# Patient Record
Sex: Female | Born: 1970 | Race: White | Hispanic: No | Marital: Married | Smoking: Never smoker
Health system: Southern US, Community
[De-identification: ages and names within clinical notes are randomized; demographics above are authoritative.]

## PROBLEM LIST (undated history)

## (undated) DIAGNOSIS — Z973 Presence of spectacles and contact lenses: Secondary | ICD-10-CM

## (undated) DIAGNOSIS — Z87820 Personal history of traumatic brain injury: Secondary | ICD-10-CM

## (undated) DIAGNOSIS — R002 Palpitations: Secondary | ICD-10-CM

## (undated) DIAGNOSIS — Z8249 Family history of ischemic heart disease and other diseases of the circulatory system: Secondary | ICD-10-CM

## (undated) DIAGNOSIS — Z9289 Personal history of other medical treatment: Secondary | ICD-10-CM

## (undated) DIAGNOSIS — N938 Other specified abnormal uterine and vaginal bleeding: Secondary | ICD-10-CM

## (undated) DIAGNOSIS — I10 Essential (primary) hypertension: Secondary | ICD-10-CM

## (undated) DIAGNOSIS — Z8639 Personal history of other endocrine, nutritional and metabolic disease: Secondary | ICD-10-CM

## (undated) HISTORY — DX: Personal history of other medical treatment: Z92.89

## (undated) HISTORY — DX: Palpitations: R00.2

## (undated) HISTORY — DX: Family history of ischemic heart disease and other diseases of the circulatory system: Z82.49

## (undated) HISTORY — PX: UMBILICAL HERNIA REPAIR: SHX196

## (undated) HISTORY — PX: HERNIA REPAIR: SHX51

---

## 1998-04-11 ENCOUNTER — Other Ambulatory Visit: Admission: RE | Admit: 1998-04-11 | Discharge: 1998-04-11 | Payer: Self-pay | Admitting: Obstetrics and Gynecology

## 1999-04-28 ENCOUNTER — Other Ambulatory Visit: Admission: RE | Admit: 1999-04-28 | Discharge: 1999-04-28 | Payer: Self-pay | Admitting: Obstetrics and Gynecology

## 2000-05-07 ENCOUNTER — Other Ambulatory Visit: Admission: RE | Admit: 2000-05-07 | Discharge: 2000-05-07 | Payer: Self-pay | Admitting: Obstetrics and Gynecology

## 2000-11-04 ENCOUNTER — Other Ambulatory Visit: Admission: RE | Admit: 2000-11-04 | Discharge: 2000-11-04 | Payer: Self-pay | Admitting: Obstetrics and Gynecology

## 2001-05-23 ENCOUNTER — Other Ambulatory Visit: Admission: RE | Admit: 2001-05-23 | Discharge: 2001-05-23 | Payer: Self-pay | Admitting: Obstetrics and Gynecology

## 2002-09-11 ENCOUNTER — Other Ambulatory Visit: Admission: RE | Admit: 2002-09-11 | Discharge: 2002-09-11 | Payer: Self-pay | Admitting: Obstetrics and Gynecology

## 2003-04-15 ENCOUNTER — Inpatient Hospital Stay (HOSPITAL_COMMUNITY): Admission: AD | Admit: 2003-04-15 | Discharge: 2003-04-17 | Payer: Self-pay | Admitting: Obstetrics and Gynecology

## 2003-04-18 ENCOUNTER — Encounter: Admission: RE | Admit: 2003-04-18 | Discharge: 2003-05-18 | Payer: Self-pay | Admitting: Obstetrics and Gynecology

## 2003-05-26 ENCOUNTER — Other Ambulatory Visit: Admission: RE | Admit: 2003-05-26 | Discharge: 2003-05-26 | Payer: Self-pay | Admitting: Obstetrics and Gynecology

## 2003-05-31 ENCOUNTER — Encounter: Payer: Self-pay | Admitting: Internal Medicine

## 2003-05-31 LAB — CONVERTED CEMR LAB

## 2003-06-18 ENCOUNTER — Encounter: Admission: RE | Admit: 2003-06-18 | Discharge: 2003-07-18 | Payer: Self-pay | Admitting: Obstetrics and Gynecology

## 2004-06-15 ENCOUNTER — Ambulatory Visit: Payer: Self-pay | Admitting: Internal Medicine

## 2004-09-29 ENCOUNTER — Ambulatory Visit: Payer: Self-pay | Admitting: Internal Medicine

## 2005-06-18 ENCOUNTER — Ambulatory Visit: Payer: Self-pay | Admitting: Internal Medicine

## 2005-06-27 ENCOUNTER — Ambulatory Visit: Payer: Self-pay | Admitting: Internal Medicine

## 2006-06-11 ENCOUNTER — Ambulatory Visit: Payer: Self-pay | Admitting: Family Medicine

## 2006-07-03 ENCOUNTER — Ambulatory Visit: Payer: Self-pay | Admitting: Internal Medicine

## 2007-02-18 ENCOUNTER — Encounter: Payer: Self-pay | Admitting: Internal Medicine

## 2007-03-12 ENCOUNTER — Inpatient Hospital Stay (HOSPITAL_COMMUNITY): Admission: AD | Admit: 2007-03-12 | Discharge: 2007-03-12 | Payer: Self-pay | Admitting: Obstetrics & Gynecology

## 2007-03-13 ENCOUNTER — Inpatient Hospital Stay (HOSPITAL_COMMUNITY): Admission: AD | Admit: 2007-03-13 | Discharge: 2007-03-13 | Payer: Self-pay | Admitting: Obstetrics and Gynecology

## 2007-03-18 ENCOUNTER — Ambulatory Visit: Payer: Self-pay | Admitting: Internal Medicine

## 2007-05-19 ENCOUNTER — Encounter: Payer: Self-pay | Admitting: Internal Medicine

## 2007-06-06 ENCOUNTER — Inpatient Hospital Stay (HOSPITAL_COMMUNITY): Admission: RE | Admit: 2007-06-06 | Discharge: 2007-06-08 | Payer: Self-pay | Admitting: Obstetrics and Gynecology

## 2007-06-09 ENCOUNTER — Encounter: Admission: RE | Admit: 2007-06-09 | Discharge: 2007-07-08 | Payer: Self-pay | Admitting: Obstetrics and Gynecology

## 2007-07-01 ENCOUNTER — Encounter: Payer: Self-pay | Admitting: Internal Medicine

## 2007-07-09 ENCOUNTER — Encounter: Admission: RE | Admit: 2007-07-09 | Discharge: 2007-08-08 | Payer: Self-pay | Admitting: Obstetrics and Gynecology

## 2007-08-09 ENCOUNTER — Encounter: Admission: RE | Admit: 2007-08-09 | Discharge: 2007-09-08 | Payer: Self-pay | Admitting: Obstetrics and Gynecology

## 2007-09-09 ENCOUNTER — Encounter: Admission: RE | Admit: 2007-09-09 | Discharge: 2007-09-27 | Payer: Self-pay | Admitting: Obstetrics and Gynecology

## 2007-10-07 ENCOUNTER — Encounter: Admission: RE | Admit: 2007-10-07 | Discharge: 2007-11-06 | Payer: Self-pay | Admitting: Obstetrics and Gynecology

## 2007-10-31 ENCOUNTER — Ambulatory Visit: Payer: Self-pay | Admitting: Internal Medicine

## 2007-11-07 ENCOUNTER — Encounter: Admission: RE | Admit: 2007-11-07 | Discharge: 2007-12-01 | Payer: Self-pay | Admitting: Obstetrics and Gynecology

## 2007-11-19 ENCOUNTER — Encounter: Payer: Self-pay | Admitting: Internal Medicine

## 2007-12-30 ENCOUNTER — Ambulatory Visit: Payer: Self-pay | Admitting: Internal Medicine

## 2007-12-30 DIAGNOSIS — T6191XA Toxic effect of unspecified seafood, accidental (unintentional), initial encounter: Secondary | ICD-10-CM | POA: Insufficient documentation

## 2007-12-30 DIAGNOSIS — L503 Dermatographic urticaria: Secondary | ICD-10-CM

## 2007-12-30 DIAGNOSIS — T61781A Other shellfish poisoning, accidental (unintentional), initial encounter: Secondary | ICD-10-CM | POA: Insufficient documentation

## 2007-12-31 ENCOUNTER — Encounter (INDEPENDENT_AMBULATORY_CARE_PROVIDER_SITE_OTHER): Payer: Self-pay | Admitting: *Deleted

## 2008-01-26 ENCOUNTER — Ambulatory Visit: Payer: Self-pay | Admitting: Internal Medicine

## 2008-01-26 LAB — CONVERTED CEMR LAB
Basophils Absolute: 0 10*3/uL (ref 0.0–0.1)
Eosinophils Absolute: 0.1 10*3/uL (ref 0.0–0.7)
IgE (Immunoglobulin E), Serum: 83.9 intl units/mL (ref 0.0–180.0)
Lymphocytes Relative: 39 % (ref 12.0–46.0)
MCV: 89.5 fL (ref 78.0–100.0)
Monocytes Absolute: 0.4 10*3/uL (ref 0.1–1.0)
Neutro Abs: 2.7 10*3/uL (ref 1.4–7.7)
Platelets: 239 10*3/uL (ref 150–400)
RDW: 13.2 % (ref 11.5–14.6)
WBC: 5.2 10*3/uL (ref 4.5–10.5)

## 2008-02-23 ENCOUNTER — Ambulatory Visit: Payer: Self-pay | Admitting: Internal Medicine

## 2008-02-28 ENCOUNTER — Encounter: Payer: Self-pay | Admitting: Internal Medicine

## 2008-04-28 ENCOUNTER — Ambulatory Visit: Payer: Self-pay | Admitting: Internal Medicine

## 2008-07-29 ENCOUNTER — Ambulatory Visit: Payer: Self-pay | Admitting: Family Medicine

## 2009-05-24 ENCOUNTER — Ambulatory Visit: Payer: Self-pay | Admitting: Internal Medicine

## 2009-08-05 ENCOUNTER — Ambulatory Visit: Payer: Self-pay | Admitting: Family

## 2009-08-05 ENCOUNTER — Telehealth: Payer: Self-pay | Admitting: Family

## 2009-08-05 ENCOUNTER — Ambulatory Visit: Payer: Self-pay | Admitting: Internal Medicine

## 2009-08-05 LAB — CONVERTED CEMR LAB: Rapid Strep: NEGATIVE

## 2009-08-20 ENCOUNTER — Ambulatory Visit: Payer: Self-pay | Admitting: Family Medicine

## 2009-08-20 DIAGNOSIS — R197 Diarrhea, unspecified: Secondary | ICD-10-CM

## 2009-08-22 ENCOUNTER — Encounter (INDEPENDENT_AMBULATORY_CARE_PROVIDER_SITE_OTHER): Payer: Self-pay | Admitting: *Deleted

## 2009-08-22 ENCOUNTER — Ambulatory Visit: Payer: Self-pay | Admitting: Internal Medicine

## 2009-08-22 ENCOUNTER — Encounter: Payer: Self-pay | Admitting: Family Medicine

## 2009-08-22 LAB — CONVERTED CEMR LAB
OCCULT 1: NEGATIVE
OCCULT 3: NEGATIVE

## 2009-08-23 ENCOUNTER — Telehealth (INDEPENDENT_AMBULATORY_CARE_PROVIDER_SITE_OTHER): Payer: Self-pay | Admitting: *Deleted

## 2010-01-16 ENCOUNTER — Ambulatory Visit: Payer: Self-pay | Admitting: Internal Medicine

## 2010-01-16 DIAGNOSIS — M545 Low back pain: Secondary | ICD-10-CM

## 2010-01-16 DIAGNOSIS — R350 Frequency of micturition: Secondary | ICD-10-CM

## 2010-01-16 DIAGNOSIS — M549 Dorsalgia, unspecified: Secondary | ICD-10-CM | POA: Insufficient documentation

## 2010-01-16 LAB — CONVERTED CEMR LAB
Blood in Urine, dipstick: NEGATIVE
Glucose, Urine, Semiquant: NEGATIVE
Ketones, urine, test strip: NEGATIVE
Nitrite: NEGATIVE
WBC Urine, dipstick: NEGATIVE
pH: 7

## 2010-01-17 ENCOUNTER — Encounter: Payer: Self-pay | Admitting: Internal Medicine

## 2010-02-06 ENCOUNTER — Ambulatory Visit (HOSPITAL_BASED_OUTPATIENT_CLINIC_OR_DEPARTMENT_OTHER): Admission: RE | Admit: 2010-02-06 | Discharge: 2010-02-06 | Payer: Self-pay | Admitting: General Surgery

## 2010-05-15 ENCOUNTER — Encounter: Payer: Self-pay | Admitting: Internal Medicine

## 2010-08-28 ENCOUNTER — Ambulatory Visit
Admission: RE | Admit: 2010-08-28 | Discharge: 2010-08-28 | Payer: Self-pay | Source: Home / Self Care | Attending: Internal Medicine | Admitting: Internal Medicine

## 2010-08-28 DIAGNOSIS — J019 Acute sinusitis, unspecified: Secondary | ICD-10-CM | POA: Insufficient documentation

## 2010-08-29 NOTE — Assessment & Plan Note (Signed)
Summary: SORE THROAT,COUGH/RH......Marland Kitchen   Vital Signs:  Patient profile:   40 year old female Weight:      148.38 pounds Temp:     98.3 degrees F oral Pulse rate:   64 / minute BP sitting:   100 / 60  Vitals Entered By: Kandice Hams (August 05, 2009 11:49 AM) CC: c/o cough, sore throat,    Primary Care Provider:  Rockie Neighbours  CC:  c/o cough, sore throat, and .  History of Present Illness: Ms Bowens is a 40 year old female with cough cold symptoms started 16 days ago- then developed sinus congestion.  Sinus congestion has improved, but cough has persisted and patient has had sore throat x 4 days.  Notes that her son has strep throat.  Used mucinex with some relief.  Allergies: No Known Drug Allergies  Review of Systems       notes low grade temp last week, now resolved.  Denies, nausea/vomitting/diarrhea.  Bilateral ear tenderness L greater than R  Physical Exam  General:  Well-developed,well-nourished,in no acute distress; alert,appropriate and cooperative throughout examination Ears:  R TM, mild bulge, no erythema, L TM retracted Mouth:  Oral mucosa and oropharynx without lesions or exudates.  Teeth in good repair. Neck:  mild anterior cervical LAD Lungs:  Normal respiratory effort, chest expands symmetrically. Lungs are clear to auscultation, no crackles or wheezes. Heart:  Normal rate and regular rhythm. S1 and S2 normal without gallop, murmur, click, rub or other extra sounds.   Impression & Recommendations:  Problem # 1:  ACUTE BRONCHITIS (ICD-466.0) Assessment New Given 2 week hx of cough, will plan to treat with cefdinir and check a cxr to r/o pneumonia Orders: T-2 View CXR (71020TC)  Her updated medication list for this problem includes:    Cefdinir 300 Mg Caps (Cefdinir) ..... One tablet by mouth two times a day x 7 days    Tessalon Perles 100 Mg Caps (Benzonatate) ..... One cap by mouth three times a day as needed cough  Complete Medication List: 1)   Multivitamins Tabs (Multiple vitamin) .... Once daily 2)  Cefdinir 300 Mg Caps (Cefdinir) .... One tablet by mouth two times a day x 7 days 3)  Tessalon Perles 100 Mg Caps (Benzonatate) .... One cap by mouth three times a day as needed cough  Patient Instructions: 1)  Please call for follow up  if your symptoms worsen or do not improve. 2)  We will call you with your chest x-ray results Prescriptions: TESSALON PERLES 100 MG CAPS (BENZONATATE) one cap by mouth three times a day as needed cough  #30 x 0   Entered and Authorized by:   Lemont Fillers FNP   Signed by:   Lemont Fillers FNP on 08/05/2009   Method used:   Electronically to        [id number] (retail)       [address]       [place], [place]  [address]       Ph: [phone] or [phone]       Fax: [phone]   RxID:   [id number] CEFDINIR 300 MG CAPS (CEFDINIR) one tablet by mouth two times a day x 7 days  #14 x 0   Entered and Authorized by:   Lemont Fillers FNP   Signed by:   Lemont Fillers FNP on 08/05/2009   Method used:  Electronically to        [id number] (retail)       [address]       [place], [place]  [address]       Ph: [phone] or [phone]       Fax: [phone]   RxID:   [id number]   Laboratory Results    Other Tests  Rapid Strep: negative

## 2010-08-29 NOTE — Assessment & Plan Note (Signed)
Summary: possible kidney infection//kn   Vital Signs:  Patient profile:   40 year old female Weight:      153.8 pounds Temp:     98.8 degrees F oral Pulse rate:   72 / minute Resp:     15 per minute BP sitting:   124 / 70  (left arm) Cuff size:   large  Vitals Entered By: Shonna Chock (January 16, 2010 2:09 PM) CC: Left lower back pain and frequent urination off/on x 1 week, Back pain Comments REVIEWED MED LIST, PATIENT AGREED DOSE AND INSTRUCTION CORRECT    Primary Care Provider:  Rockie Neighbours  CC:  Left lower back pain and frequent urination off/on x 1 week and Back pain.  History of Present Illness:  Back Pain      This is a 40 year old woman who presents with  sharp back pain.  The patient reports low grade fever ( 99.0) and rest pain, but denies chills, weakness, loss of sensation, fecal incontinence, urinary incontinence, urinary retention, dysuria, and inability to care for self.  The pain is located in the left low back.  The pain began at home and gradually.  The pain  was  made worse by urination after drinking wine 01/14/2010.  The pain is made better by inactivity/ rest. She repeatedly lifts her 30 # two year old; this causes pain in area. Umbilical hernia planned for 02/06/10 by Dr Lindie Spruce.  Allergies: 1)  ! * Omnicef  Review of Systems General:  Denies chills and sweats. GU:  Denies discharge and hematuria. Derm:  Denies lesion(s) and rash. Neuro:  Denies brief paralysis, numbness, tingling, and weakness.  Physical Exam  General:  well-nourished,in no acute distress; alert,appropriate and cooperative throughout examination Lungs:  Normal respiratory effort, chest expands symmetrically. Lungs are clear to auscultation, no crackles or wheezes. Heart:  regular rhythm, no murmur, no gallop, no rub, no JVD, and bradycardia.   Abdomen:  Bowel sounds positive,abdomen soft and non-tender without masses, organomegaly . Umbilical  hernia  noted. Msk:  No deformity or scoliosis  noted of thoracic or lumbar spine.  Slight tenderness to percussion LLS area Pulses:  R and L dorsalis pedis and posterior tibial pulses are full and equal bilaterally Extremities:  No clubbing, cyanosis, edema, or deformity noted with normal full range of motion of all joints. Neg SLR  Neurologic:  alert & oriented X3, strength normal in all extremities, gait normal, and DTRs symmetrical and normal.   Skin:  Intact without suspicious lesions or rashes Cervical Nodes:  No lymphadenopathy noted Axillary Nodes:  No palpable lymphadenopathy Psych:  memory intact for recent and remote, normally interactive, and good eye contact.     Impression & Recommendations:  Problem # 1:  LOW BACK PAIN, ACUTE (ICD-724.2)  Her updated medication list for this problem includes:    Cyclobenzaprine Hcl 5 Mg Tabs (Cyclobenzaprine hcl) .Marland Kitchen... 1-2 at bedtime as needed for back pain    Tramadol Hcl 50 Mg Tabs (Tramadol hcl) .Marland Kitchen... 1 every 6 hrs as needed back pain  Problem # 2:  FREQUENCY, URINARY (ICD-788.41)  Orders: T-Culture, Urine ([id number])  Complete Medication List: 1)  Multivitamins Tabs (Multiple vitamin) .... Once daily 2)  Cyclobenzaprine Hcl 5 Mg Tabs (Cyclobenzaprine hcl) .Marland Kitchen.. 1-2 at bedtime as needed for back pain 3)  Tramadol Hcl 50 Mg Tabs (Tramadol hcl) .Marland Kitchen.. 1 every 6 hrs as needed back pain  Other Orders: UA Dipstick w/o Micro (manual) (09811)  Patient Instructions: 1)  Avoid lifting if possible.Proper technique if absolutely necessary Prescriptions: TRAMADOL HCL 50 MG TABS (TRAMADOL HCL) 1 every 6 hrs as needed back pain  #30 x 0   Entered and Authorized by:   Marga Melnick MD   Signed by:   Marga Melnick MD on 01/16/2010   Method used:   Faxed to ...       [facility]  [address] [id number] (retail)       [address]       [place], [place]  [address]       Ph: [phone] or [phone]       Fax: [phone]   RxID:   [id number] CYCLOBENZAPRINE HCL  5 MG TABS (CYCLOBENZAPRINE HCL) 1-2 at bedtime as needed for back pain  #20 x 0   Entered and Authorized by:   Marga Melnick MD   Signed by:   Marga Melnick MD on 01/16/2010   Method used:   Faxed to ...       [facility]  [address] [id number] (retail)       [address]       [place], [place]  [address]       Ph: [phone] or [phone]       Fax: [phone]   RxID:   [id number]   Laboratory Results   Urine Tests   Date/Time Reported: January 16, 2010 2:05 PM   Routine Urinalysis   Color: colorless Appearance: Clear Glucose: negative   (Normal Range: Negative) Bilirubin: negative   (Normal Range: Negative) Ketone: negative   (Normal Range: Negative) Spec. Gravity: <1.005   (Normal Range: 1.003-1.035) Blood: negative   (Normal Range: Negative) pH: 7.0   (Normal Range: 5.0-8.0) Protein: negative   (Normal Range: Negative) Urobilinogen: negative   (Normal Range: 0-1) Nitrite: negative   (Normal Range: Negative) Leukocyte Esterace: negative   (Normal Range: Negative)    Comments: Floydene Flock  January 16, 2010 2:05 PM

## 2010-08-29 NOTE — Miscellaneous (Signed)
Summary: Flu/[facility]  [facility]   Imported By: Lanelle Bal 06/07/2010 08:40:15  _____________________________________________________________________  External Attachment:    Type:   Image     Comment:   External Document

## 2010-08-29 NOTE — Assessment & Plan Note (Signed)
Summary: diahrea/fever/kdc   Vital Signs:  Patient profile:   40 year old female Weight:      145 pounds Temp:     98.9 degrees F oral Pulse rate:   104 / minute Pulse rhythm:   regular BP sitting:   100 / 70  (left arm) Cuff size:   regular  Vitals Entered By: Mervin Hack CMA Duncan Dull) (August 20, 2009 10:35 AM) CC: diarrheax10days?, Diarrhea   History of Present Illness:  Diarrhea      This is a 40 year old woman who presents with Diarrhea.  The symptoms began 1 week ago.  The patient complains of 4-6 stools per day and watery/unformed stools, but denies 3 stools or less per day, >6 stools per day, semiformed/loose stools, voluminous stools, blood in stool, mucus in stool, greasy stools, malodorous stools, fecal urgency, fecal soiling, alternating diarrhea/constipation, nocturnal diarrhea, fasting diarrhea, bloating, gassiness, abrupt onset of symptoms, and gradual onset of symptoms.  Associated symptoms include fever, abdominal cramps, nausea, and lightheadedness.  The patient denies abdominal pain, vomiting, increased thirst, weight loss, joint pains, mouth ulcers, and eye redness.  The symptoms are worse with any food.  Patient's risk factors for diarrhea include recent antibiotic use.    Current Medications (verified): 1)  Multivitamins   Tabs (Multiple Vitamin) .... Once Daily 2)  Metronidazole 500 Mg Tabs (Metronidazole) .Marland Kitchen.. 1 By Mouth Three Times A Day X 10 Days 3)  Levsin/sl 0.125 Mg Subl (Hyoscyamine Sulfate) .Marland Kitchen.. 1-2  Sl  Every 4 Hours As Needed  Allergies (verified): No Known Drug Allergies  Past History:  Past medical, surgical, family and social histories (including risk factors) reviewed for relevance to current acute and chronic problems.  Past Medical History: Reviewed history from 01/26/2008 and no changes required. syncope age [age] positive ppd 1999-increased lipids 09/2001-liver enzymes increased PAT during pregnancy  FH:  P-unlce  melanoma father-HTN,TIA's, hockey injury high cholesterol aunt-rectal cancer m-uncle-schizophrenia  Past Surgical History: Reviewed history from 12/30/2007 and no changes required. fractured skull at age 46,no seizure sequella  syncope age 10, negative EEG  Family History: Reviewed history from 01/26/2008 and no changes required. cancer-melanoma allergies-seasonal  Social History: Reviewed history from 01/26/2008 and no changes required. 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  Teeth in good repair. Abdomen:  Bowel sounds positive,abdomen soft and non-tender without masses, organomegaly or hernias noted. Psych:  Cognition and judgment appear intact. Alert and cooperative with normal attention span and concentration. No apparent delusions, illusions, hallucinations   Impression & Recommendations:  Problem # 1:  DIARRHEA (ICD-787.91) ? C diff rx flagyl and cipro to er over weekend if symptoms worsen---otherwise f/u Dr Alwyn Ren next week Orders: T-Culture, C-Diff Toxin A/B [id number] [phone])  Discussed symptom control and diet. Call if worsening of symptoms or signs of dehydration.   Complete Medication List: 1)  Multivitamins Tabs (Multiple vitamin) .... Once daily 2)  Metronidazole 500 Mg Tabs (Metronidazole) .Marland Kitchen.. 1 by mouth three times a day x 10 days 3)  Levsin/sl 0.125 Mg Subl (Hyoscyamine sulfate) .Marland Kitchen.. 1-2  sl  every 4 hours as needed Prescriptions: METRONIDAZOLE 500 MG TABS (METRONIDAZOLE) 1 by mouth three times a day x 10 days  #30 x 0   Entered and Authorized by:   Loreen Freud DO  Signed by:   Loreen Freud DO on 08/20/2009   Method used:   Electronically to        [id number] (retail)        [address]       [place], [place]  [address]       Ph: [phone] or [phone]       Fax: [phone]   RxID:   [id number] LEVSIN/SL 0.125 MG SUBL (HYOSCYAMINE SULFATE) 1-2  sl  every 4 hours as needed  #20 x 0   Entered and Authorized by:   Loreen Freud DO   Signed by:   Loreen Freud DO on 08/20/2009   Method used:   Electronically to        [id number]* (retail)       [address]       [place], [place]  [address]       Ph: [phone] or [phone]       Fax: [phone]   RxID:   [id number] METRONIDAZOLE 500 MG TABS (METRONIDAZOLE) 1 by mouth three times a day x 10 days  #30 x 0   Entered and Authorized by:   Loreen Freud DO   Signed by:   Loreen Freud DO on 08/20/2009   Method used:   Print then Give to Patient   RxID:   [id number] METRONIDAZOLE 500 MG TABS (METRONIDAZOLE) 1 by mouth three times a day x 10 days  #30 x 0   Entered and Authorized by:   Loreen Freud DO   Signed by:   Loreen Freud DO on 08/20/2009   Method used:   Print then Give to Patient   RxID:   [id number]   Current Allergies (reviewed today): No known allergies   Appended Document: diahrea/fever/kdc correction----pt was not put on cipro----only flagyl

## 2010-08-29 NOTE — Progress Notes (Signed)
Summary: Results  Phone Note From Other Clinic   Caller: [facility] Summary of Call: [facility] called pt tested posted for c-diff. Per Dr. Laury Axon make sure she takes all of the Metronidazole. I left a message at both the home and work number. Army Fossa CMA  August 23, 2009 8:41 AM   Follow-up for Phone Call        Pt is aware. Army Fossa CMA  August 23, 2009 10:14 AM

## 2010-08-29 NOTE — Assessment & Plan Note (Signed)
Summary: FOR DIARRHEA//ph   Vital Signs:  Patient profile:   40 year old female Weight:      144.4 pounds Temp:     99.1 degrees F oral Pulse rate:   84 / minute Resp:     14 per minute BP sitting:   100 / 64  (left arm)  Vitals Entered By: Shonna Chock (August 22, 2009 3:35 PM) CC: Follow-up visit: [facility] Comments REVIEWED MED LIST, PATIENT AGREED DOSE AND INSTRUCTION CORRECT    Primary Care Provider:  Rockie Neighbours  CC:  Follow-up visit: seen at [facility] for diarrhea.  History of Present Illness: Diarrhea  for 12 days;still completely liquid but volume & frequency decreasing since antibiotics Rxed 08/20/2009. Now every 3-4 hrs , previously it every 1-2 hrs.Levsin  & Immodium AD caused cramping ; she took it once. She took Omnicef x 7 days in 07/2009; diarrhea started last 1-2 days of Omnicef.  Allergies (verified): 1)  ! * Omnicef  Review of Systems General:  Complains of chills, fever, and sweats; Temp decreasing; max 103 last 01/21. GI:  Complains of abdominal pain; denies bloody stools and dark tarry stools.  Physical Exam  General:  Thin but well-nourished,in no acute distress; alert,appropriate and cooperative throughout examination Eyes:  No corneal or conjunctival inflammation noted.No icterus. Perrla.  Mouth:  Oral mucosa and oropharynx without lesions or exudates.  Teeth in good repair. Lungs:  Normal respiratory effort, chest expands symmetrically. Lungs are clear to auscultation, no crackles or wheezes. Heart:  tachycardia and S4 gallop.   Abdomen:  Bowel sounds positive,abdomen soft and non-tender without masses, organomegaly or hernias noted. Skin:  Intact without suspicious lesions or rashes. No tenting or jaundice Cervical Nodes:  No lymphadenopathy noted Axillary Nodes:  No palpable lymphadenopathy   Impression & Recommendations:  Problem # 1:  DIARRHEA (ICD-787.91)  Her updated medication list for this problem  includes:    Diphenoxylate-atropine 2.5-0.025 Mg Tabs (Diphenoxylate-atropine) .Marland Kitchen... 1 as needed for frank diarrhea  Orders: Venipuncture ([address]) TLB-CBC Platelet - w/Differential (85025-CBCD) TLB-Creatinine, Blood (82565-CREA) TLB-Potassium (K+) (84132-K) TLB-BUN (Urea Nitrogen) (84520-BUN)  Complete Medication List: 1)  Multivitamins Tabs (Multiple vitamin) .... Once daily 2)  Metronidazole 500 Mg Tabs (Metronidazole) .Marland Kitchen.. 1 by mouth three times a day x 10 days 3)  Levsin/sl 0.125 Mg Subl (Hyoscyamine sulfate) .Marland Kitchen.. 1-2  sl  every 4 hours as needed 4)  Diphenoxylate-atropine 2.5-0.025 Mg Tabs (Diphenoxylate-atropine) .Marland Kitchen.. 1 as needed for frank diarrhea  Patient Instructions: 1)  Align daily  until bowels are normal. 2)  Drink clear liquids only for the next 24 hours, then slowly add other liquids and food as you  tolerate them. Prescriptions: DIPHENOXYLATE-ATROPINE 2.5-0.025 MG TABS (DIPHENOXYLATE-ATROPINE) 1 as needed for frank diarrhea  #10 x 0   Entered and Authorized by:   Marga Melnick MD   Signed by:   Marga Melnick MD on 08/22/2009   Method used:   Print then Give to Patient   RxID:   [id number]

## 2010-08-29 NOTE — Letter (Signed)
Summary: Results Follow up Letter  [facility]  [address] [place], [place] [address]   Phone: [phone]  Fax: [phone]    08/22/2009 MRN: [id number]  [facility] [address] [place], [place]  [address]  Dear Ms. [facility],  The following are the results of your recent test(s):  Test         Result    Pap Smear:        Normal _____  Not Normal _____ Comments: ______________________________________________________ Cholesterol: LDL(Bad cholesterol):         Your goal is less than:         HDL (Good cholesterol):       Your goal is more than: Comments:  ______________________________________________________ Mammogram:        Normal _____  Not Normal _____ Comments:  ___________________________________________________________________ Hemoccult:        Normal _X____  Not normal _______ Comments:    _____________________________________________________________________ Other Tests:    We routinely do not discuss normal results over the telephone.  If you desire a copy of the results, or you have any questions about this information we can discuss them at your next office visit.   Sincerely,

## 2010-08-29 NOTE — Progress Notes (Signed)
  Phone Note Outgoing Call   Call placed by: Lemont Fillers FNP,  August 05, 2009 6:00 PM Summary of Call: Called patient reviewed chest x-ray is negative Initial call taken by: Lemont Fillers FNP,  August 05, 2009 6:01 PM

## 2010-09-06 NOTE — Assessment & Plan Note (Signed)
Summary: SINUS INFECTION/KN   Vital Signs:  Patient profile:   40 year old female Weight:      150.4 pounds Temp:     98.7 degrees F oral Pulse rate:   76 / minute Resp:     15 per minute BP sitting:   110 / 68  (left arm) Cuff size:   large  Vitals Entered By: Shonna Chock CMA (August 28, 2010 12:02 PM) CC: Sinus Infection/ cold symptoms x 12 days , URI symptoms   Primary Care Provider:  Rockie Neighbours  CC:  Sinus Infection/ cold symptoms x 12 days  and URI symptoms.  History of Present Illness:    Onset 2 weeks ago as rhinitis & ST;.   she now  reports nasal congestion  with  purulent nasal discharge, but denies sore throat, productive cough, and earache.  Associated symptoms include low-grade fever (<100.5 degrees).  The patient denies dyspnea and wheezing.  The patient denies frontal headache.  Risk factors for Strep sinusitis include bilateral facial pain and tooth pain.  The patient denies the following risk factors for Strep sinusitis: tender adenopathy.  Rx: Neti pot; Advil Cold & Sinus  Current Medications (verified): 1)  Multivitamins   Tabs (Multiple Vitamin) .... Once Daily 2)  Cyclobenzaprine Hcl 5 Mg Tabs (Cyclobenzaprine Hcl) .Marland Kitchen.. 1-2 At Bedtime As Needed For Back Pain 3)  Tramadol Hcl 50 Mg Tabs (Tramadol Hcl) .Marland Kitchen.. 1 Every 6 Hrs As Needed Back Pain  Allergies: 1)  ! Truman Hayward  Physical Exam  General:  Thin but well-nourished,in no acute distress; alert,appropriate and cooperative throughout examination Ears:  External ear exam shows no significant lesions or deformities.  Otoscopic examination reveals clear canals, tympanic membranes are intact bilaterally without bulging, retraction, inflammation or discharge. Hearing is grossly normal bilaterally. Nose:  External nasal examination shows no deformity or inflammation. Nasal mucosa are  erythematous without lesions or exudates. Mouth:  Oral mucosa and oropharynx without lesions or exudates.  Teeth in good  repair. Lungs:  Normal respiratory effort, chest expands symmetrically. Lungs are clear to auscultation, no crackles or wheezes. Cervical Nodes:  No lymphadenopathy noted Axillary Nodes:  No palpable lymphadenopathy   Impression & Recommendations:  Problem # 1:  SINUSITIS- ACUTE-NOS (ICD-461.9)  Maxillary  Her updated medication list for this problem includes:    Clarithromycin 500 Mg Xr24h-tab (Clarithromycin) .Marland Kitchen... 2 once daily with a meal    Fluticasone Propionate 50 Mcg/act Susp (Fluticasone propionate) .Marland Kitchen... 1 spray two times a day as needed  Complete Medication List: 1)  Multivitamins Tabs (Multiple vitamin) .... Once daily 2)  Cyclobenzaprine Hcl 5 Mg Tabs (Cyclobenzaprine hcl) .Marland Kitchen.. 1-2 at bedtime as needed for back pain 3)  Tramadol Hcl 50 Mg Tabs (Tramadol hcl) .Marland Kitchen.. 1 every 6 hrs as needed back pain 4)  Clarithromycin 500 Mg Xr24h-tab (Clarithromycin) .... 2 once daily with a meal 5)  Fluticasone Propionate 50 Mcg/act Susp (Fluticasone propionate) .Marland Kitchen.. 1 spray two times a day as needed  Patient Instructions: 1)  Align once daily as needed for any bowel changes. 2)  Drink as much NON dairy fluid as you can tolerate for the next few days. Prescriptions: FLUTICASONE PROPIONATE 50 MCG/ACT SUSP (FLUTICASONE PROPIONATE) 1 spray two times a day as needed  #1 x 11   Entered and Authorized by:   Marga Melnick MD   Signed by:   Marga Melnick MD on 08/28/2010   Method used:   Electronically to  [facility]  [address] [id number] (retail)       [address]       [place], [place]  [address]       Ph: [phone] or [phone]       Fax: [phone]   RxID:   [id number] CLARITHROMYCIN 500 MG XR24H-TAB (CLARITHROMYCIN) 2 once daily with a meal  #20 x 0   Entered and Authorized by:   Marga Melnick MD   Signed by:   Marga Melnick MD on 08/28/2010   Method used:   Electronically to        [id number] (retail)       [address]       [place], [place]  [address]       Ph: [phone] or [phone]       Fax: [phone]   RxID:   [id number]    Orders Added: 1)  Est. Patient Level III [phone]

## 2010-10-15 LAB — POCT HEMOGLOBIN-HEMACUE: Hemoglobin: 12.1 g/dL (ref 12.0–15.0)

## 2010-12-12 NOTE — Letter (Signed)
March 18, 2007    Armanda Magic, M.D.  [address], [address]  [place], [place] [address]   RE:  BRISTYL, MCLEES  MRN:  [id number]  /  DOB:  Nov 24, 1970   Dear Gloris Manchester:   It was a pleasure to see Whitney Bowman at your request today regarding  her palpitations.  As you know, she is a 40 year old gravida 2 who is  now 29 weeks into her pregnancy.  About two months ago, she started  noticing increasing heart rates, primarily provoked by exertion but not  solely.  They would occur relatively gradually, abate relatively  gradually.  They were sometimes accompanied by dizziness.   She has had a number of episodes of presyncope that have occurred with  standing quickly or standing too long.  They are accompanied by  diaphoresis, nausea and extreme pallor.  She has to lie down and put her  feet up, and they resolve after about 5-10 minutes.   During her pregnancy, she has also had more problems with orthostatic  intolerance and shower intolerance.  Her diet is relatively salt  depleted, though her volume status is quite good.   She has a long-standing history of recurrent syncope.  These episodes  would occur when she would get over heated, be exposed to blood.  They  were associated with stereotypical prodrome which is recognizable with  recurrent symptoms.  Although, it is not identical.   Her cardiac evaluation that you have undertaken included an echo that  was normal and a Holter monitor which she wore for 48 hours during which  she was quite symptomatic in which you describe only variations of sinus  rhythm.   PAST MEDICAL HISTORY:  1. GE reflux disease.  2. Anemia.  3. Allergies.  4. Fatigue.  5. She has a history of a fractured skull when she was 40 years old.   SOCIAL HISTORY:  She is a Photographer.  She has one child and one on the  way.  She is married.  She does not use cigarettes, alcohol or  recreational drugs.   MEDICATIONS:  Prenatal vitamins.   ALLERGIES:   No known drug allergies.   PHYSICAL EXAMINATION:  GENERAL:  She is a middle aged Caucasian female  appearing her stated age of 70.  VITAL SIGNS:  Blood pressure 105/71, pulse 107 lying, sitting was 107/66  with a pulse of 97, standing at 0 minutes it was 111/72 with a pulse of  107, and standing at 2 minutes it was 109/75 with a pulse of 111 and was  stable at 5 minutes.  HEENT:  No icterus or xanthomatous.  NECK:  Veins were flat.  Carotids were brisk and full bilaterally  without bruits.  BACK:  Without kyphosis or scoliosis.  LUNGS:  Clear.  HEART:  Sounds were regular without murmurs or gallops.  ABDOMEN:  Gravid.  EXTREMITIES:  Femoral pulses were not examined.  Distal pulses were  intact.  There was no cyanosis, clubbing or edema.  NEUROLOGICAL:  Grossly normal.  SKIN:  Warm and dry.   STUDIES:  Electrocardiogram dated today demonstrated sinus rhythm at 92  with intervals of 0.13/0.07/0.35.  The axis was 31 degrees.   IMPRESSION:  1. 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  2. Remote history of neurocardiogenic syncope.  3. Presyncope associated with #1 and similar to #2.  4. Second pregnancy with symptoms that were not present on the first      pregnancy.  Traci, Ms. Winfree has a remote history of neurocardiogenic syncope and  symptoms associated with recurrent pregnancy, seem to be an extension of  that.  Specifically, she has orthostatic symptoms, heat intolerance  symptoms, prolonged standing symptoms which are abated by becoming  supine.  This would support the hypothesis of a dysautonomic process.  However, the thing that bothers me some and I will need to explore is  that it is generally felt that dysautonomic symptoms are ameliorated  during pregnancy because of the associated fluid retention.  I am not  quite sure how generalizable that is and why this patient might be worse  than a preceding pregnancy .   She asked if she would go back to  normal when the pregnancy is over, and  said that I did not know that she would like to go back to where she  belongs.  That is, has there been a change in the background level, if  you will, of dysautonomic tendency which may make her more symptomatic  following the pregnancy.   I told her that I would look into this from a literature point of view  and get back with you on that.  She is supposed to see you in two weeks  time.   Thank you very much for asking [place] to see her.    Sincerely,      Duke Salvia, MD, [facility]  Electronically Signed    SCK/MedQ  DD: 03/18/2007  DT: 03/19/2007  Job #: [id number]   CC:   Maxie Better, M.D.  Titus Dubin. Alwyn Ren, MD,FACP,FCCP

## 2010-12-15 NOTE — H&P (Signed)
NAMEELFRIEDA, ESPINO NO.:  [id number]   MEDICAL RECORD NO.:  [id number]                   PATIENT TYPE:  INP   LOCATION:  9168                                 FACILITY:  [facility]   PHYSICIAN:  Richardean Sale, M.D.                DATE OF BIRTH:  [birth year]   DATE OF ADMISSION:  04/15/2003  DATE OF DISCHARGE:                                HISTORY & PHYSICAL   CHIEF COMPLAINT:  Spontaneous rupture of membranes.   HISTORY OF PRESENT ILLNESS:  This is a 40 year old gravida 1, para 0, white  female with an estimated due date of April 18, 2003 who reported  spontaneous rupture of membranes at 1:30 a.m.  She complains of occasional  contractions, no vaginal bleeding, reports good fetal movement.  Pregnancy  complicated by first trimester bleeding that was thought to be due to a  uterine fibroid or possible low-lying placenta.  Repeat ultrasound revealed  resolution of the low-lying placenta.   PAST MEDICAL HISTORY:  Significant for traumatic head injury with skull  fracture at age 9; no residual effects.   PAST SURGICAL HISTORY:  None.   SOCIAL HISTORY:  She is married; no tobacco, alcohol, or drugs.   FAMILY HISTORY:  No history of congenital birth defects, bleeding disorders,  or inherited diseases.   GYNECOLOGIC HISTORY:  Positive history of an abnormal Pap smear in the past;  no treatment needed; Pap smears normal since.   REVIEW OF SYSTEMS:  Negative except for what is in the HPI.   PHYSICAL EXAMINATION:  VITAL SIGNS:  Afebrile, vital signs are stable.  GENERAL APPEARANCE:  Well-developed, well-nourished white female in no  apparent distress who appears mildly uncomfortable with contractions.  ABDOMEN:  Gravid, soft, nontender.  Fundus appears AGA.  EXTREMITIES:  Trace edema bilaterally.  DTRs 1 to 2+ bilaterally.  VAGINAL:  Three centimeters, 80% effaced, -1 station, vertex.  Cervical  vaginal secretions positive for Nitrazine consistent  with spontaneous  rupture of membranes.   FETAL HEART RATE TRACING:  Baseline 130s with positive accelerations,  occasional mild variables at time of admission since resolved.   TOCOMETER:  Contractions are regular every 2-6 minutes.   PRENATAL LABORATORIES:  Blood type A positive, antibody screen negative, RPR  nonreactive, rubella immune, hepatitis B surface antigen negative, HIV  nonreactive, group B beta strep negative.   MEDICATIONS:  1. Prenatal vitamins one p.o. once daily.  2. Iron supplements one p.o. once daily.  3. Ambien 5 mg p.o. at bedtime p.r.n. insomnia.   ALLERGIES:  No known drug allergies.   ASSESSMENT:  A 40 year old gravida 1, para 0 at 39-4/[redacted] weeks gestation with  due date of April 18, 2003 with spontaneous rupture of membranes and  onset of labor.   PLAN:  1. Admit to labor and delivery.  2. Continue fetal monitoring and will place internal monitors as needed.  3. We will  recheck cervix in about an hour.  If no cervical change, we will     augment Pitocin.  4. Nubain 10 mg IV q.1h. p.r.n. pain or epidural p.r.n.  5. Expect SVD.                                               Richardean Sale, M.D.    JW/MEDQ  D:  04/15/2003  T:  04/15/2003  Job:  [id number]

## 2011-05-08 LAB — CBC
Hemoglobin: 11.7 — ABNORMAL LOW
MCHC: 34.7
MCV: 92.7
Platelets: 166
RBC: 3.21 — ABNORMAL LOW
RDW: 15 — ABNORMAL HIGH
WBC: 10.1

## 2011-05-08 LAB — RPR: RPR Ser Ql: NONREACTIVE

## 2011-05-22 ENCOUNTER — Ambulatory Visit (INDEPENDENT_AMBULATORY_CARE_PROVIDER_SITE_OTHER): Payer: Managed Care, Other (non HMO)

## 2011-05-22 DIAGNOSIS — Z23 Encounter for immunization: Secondary | ICD-10-CM

## 2011-09-07 ENCOUNTER — Ambulatory Visit (INDEPENDENT_AMBULATORY_CARE_PROVIDER_SITE_OTHER): Payer: Managed Care, Other (non HMO) | Admitting: Internal Medicine

## 2011-09-07 ENCOUNTER — Encounter: Payer: Self-pay | Admitting: Internal Medicine

## 2011-09-07 VITALS — BP 108/66 | HR 74 | Temp 98.7°F | Wt 155.2 lb

## 2011-09-07 DIAGNOSIS — J209 Acute bronchitis, unspecified: Secondary | ICD-10-CM

## 2011-09-07 MED ORDER — AZITHROMYCIN 250 MG PO TABS: ORAL_TABLET | ORAL | Status: AC

## 2011-09-07 MED ORDER — HYDROCODONE-HOMATROPINE 5-1.5 MG/5ML PO SYRP: 5.0000 mL | ORAL_SOLUTION | Freq: Four times a day (QID) | ORAL | Status: AC | PRN

## 2011-09-07 NOTE — Progress Notes (Signed)
  Subjective:    Patient ID: Whitney Bowman, female    DOB: 06-19-1971, 41 y.o.   MRN: [id number]  HPI Respiratory tract infection Onset/symptoms:2/4 as slight ST , head congestion Exposures (illness/environmental/extrinsic):no Progression of symptoms:to chest by 2/6 Treatments/response:Delsym, Mucinex/ ? benefit Present symptoms: Fever/chills/sweats:low grade fever Frontal headache:no Facial pain:no Nasal purulence:no Dental pain:no Lymphadenopathy:on R Wheezing/shortness of breath:chest pressure Cough/sputum/hemoptysis:green sputum Pleuritic pain:no Associated extrinsic/allergic symptoms:itchy eyes/ sneezing:no Past medical history: Seasonal allergies: yes/asthma:no Smoking history:never           Review of Systems     Objective:   Physical Exam General appearance:thin , in good health ; no acute distress or increased work of breathing is present.  No significant  lymphadenopathy about the head, neck, or axilla noted.   Eyes: No conjunctival inflammation or lid edema is present.   Ears:  External ear exam shows no significant lesions or deformities.  Otoscopic examination reveals clear canals, tympanic membranes are intact bilaterally without bulging, retraction, inflammation or discharge.  Nose:  External nasal examination shows no deformity or inflammation. Nasal mucosa are dry without lesions or exudates. No septal dislocation or deviation.No obstruction to airflow.   Oral exam: Dental hygiene is good; lips and gums are healthy appearing.There is no oropharyngeal erythema or exudate noted.      Heart:  Normal rate and regular rhythm. S1 and S2 normal without gallop, murmur, click, rub .S4 with   Lungs:Chest clear to auscultation; no wheezes, rhonchi,rales ,or rubs present.No increased work of breathing.  Dry cough  Extremities:  No cyanosis, edema, or clubbing  noted    Skin: Warm & dry           Assessment & Plan:    #1 bronchitis; acute, purulent.  Clinically no bronchospasm  #2 no criteria for rhinosinusitis  #3 past history of cephalosporin-induced colitis  Plan: See orders and recommendations

## 2011-09-07 NOTE — Patient Instructions (Signed)
Plain Mucinex for thick secretions ;force NON dairy fluids for next 48 hrs. Use a Neti pot daily as needed for sinus congestion.  Please take the probiotic , Align, for loose stool.

## 2012-01-28 LAB — HM PAP SMEAR: HM Pap smear: NORMAL

## 2012-02-18 ENCOUNTER — Other Ambulatory Visit: Payer: Self-pay | Admitting: Obstetrics and Gynecology

## 2012-02-18 DIAGNOSIS — Z1231 Encounter for screening mammogram for malignant neoplasm of breast: Secondary | ICD-10-CM

## 2012-03-17 ENCOUNTER — Ambulatory Visit: Payer: Managed Care, Other (non HMO)

## 2012-03-18 ENCOUNTER — Ambulatory Visit
Admission: RE | Admit: 2012-03-18 | Discharge: 2012-03-18 | Disposition: A | Discharge status: Home / Self Care | Payer: Managed Care, Other (non HMO) | Source: Ambulatory Visit | Attending: Obstetrics and Gynecology | Admitting: Obstetrics and Gynecology

## 2012-03-18 DIAGNOSIS — Z1231 Encounter for screening mammogram for malignant neoplasm of breast: Secondary | ICD-10-CM

## 2012-06-04 ENCOUNTER — Ambulatory Visit (INDEPENDENT_AMBULATORY_CARE_PROVIDER_SITE_OTHER): Payer: Managed Care, Other (non HMO)

## 2012-06-04 DIAGNOSIS — Z23 Encounter for immunization: Secondary | ICD-10-CM

## 2012-08-22 ENCOUNTER — Ambulatory Visit (INDEPENDENT_AMBULATORY_CARE_PROVIDER_SITE_OTHER): Payer: 59 | Admitting: Family Medicine

## 2012-08-22 VITALS — BP 110/70 | HR 89 | Temp 99.0°F | Wt 158.0 lb

## 2012-08-22 DIAGNOSIS — J189 Pneumonia, unspecified organism: Secondary | ICD-10-CM | POA: Insufficient documentation

## 2012-08-22 MED ORDER — AZITHROMYCIN 250 MG PO TABS: ORAL_TABLET | ORAL | Status: DC

## 2012-08-22 NOTE — Progress Notes (Signed)
  Subjective:    Patient ID: Whitney Bowman, female    DOB: 04-26-1971, 42 y.o.   MRN: [id number]  HPI URI- sxs started suddenly Tuesday w/ fever, body aches, chills, cough- productive.  Tm 101.  + fatigue.  + sinus HA.  No ear pain.  + sick contacts, son had 'walking PNA'.  Pt did have flu shot.  No N/V/D.   Review of Systems For ROS see HPI     Objective:   Physical Exam  Constitutional: She appears well-developed and well-nourished. No distress.  HENT:  Head: Normocephalic and atraumatic.       TMs normal bilaterally Mild nasal congestion Throat w/out erythema, edema, or exudate No TTP over sinuses  Eyes: Conjunctivae normal and EOM are normal. Pupils are equal, round, and reactive to light.  Neck: Normal range of motion. Neck supple.  Cardiovascular: Normal rate, regular rhythm, normal heart sounds and intact distal pulses.   No murmur heard. Pulmonary/Chest: Effort normal. No respiratory distress. She has no wheezes.       + hacking cough Crackles over LLL, otherwise CTA  Lymphadenopathy:    She has no cervical adenopathy.          Assessment & Plan:

## 2012-08-22 NOTE — Patient Instructions (Addendum)
Start the [employer] for presumed pneumonia Mucinex DM for cough/congestion Drink plenty of fluids Tylenol/ibuprofen for pain/fever REST! Call with any questions or concerns, particularly if not improving Hang in there!

## 2012-08-22 NOTE — Assessment & Plan Note (Signed)
New.  Suspected based on recent exposure and LLL crackles.  Start abx to cover for atypicals.  Offered cough syrup- pt declined.  Reviewed supportive care and red flags that should prompt return.  If no improvement, will need CXR.  Pt expressed understanding and is in agreement w/ plan.

## 2012-09-25 ENCOUNTER — Ambulatory Visit (INDEPENDENT_AMBULATORY_CARE_PROVIDER_SITE_OTHER): Payer: 59 | Admitting: Internal Medicine

## 2012-09-25 ENCOUNTER — Ambulatory Visit (INDEPENDENT_AMBULATORY_CARE_PROVIDER_SITE_OTHER)
Admission: RE | Admit: 2012-09-25 | Discharge: 2012-09-25 | Disposition: A | Discharge status: Home / Self Care | Payer: 59 | Source: Ambulatory Visit | Attending: Internal Medicine | Admitting: Internal Medicine

## 2012-09-25 ENCOUNTER — Telehealth: Payer: Self-pay | Admitting: *Deleted

## 2012-09-25 ENCOUNTER — Encounter: Payer: Self-pay | Admitting: Internal Medicine

## 2012-09-25 VITALS — BP 98/66 | HR 76 | Wt 160.0 lb

## 2012-09-25 DIAGNOSIS — M79609 Pain in unspecified limb: Secondary | ICD-10-CM

## 2012-09-25 DIAGNOSIS — M79675 Pain in left toe(s): Secondary | ICD-10-CM

## 2012-09-25 NOTE — Progress Notes (Signed)
  Subjective:    Patient ID: Whitney Bowman, female    DOB: March 31, 1971, 42 y.o.   MRN: [id number]  HPI The pain began 09/17/12 in L foot  @ base of large toe without associated injury or trigger except walking 2 days before. It is described as dull- sharp  to level 8. The pain does not radiate.  The dull discomfort is present constantly; sharp pain with ambulation.  No associated signs/symptoms include redness ,swelling, stiffness, skin color change, or temperature change. The pain was treated with NSAIDS, ice & rest ; response was partial .The pain is better in am after sleeping.  She denies any ophthalmologic symptoms such as burning or discharge. She has no dysuria, hematuria, or pyuria.  Her grandmother has had vertebral fractures in the setting of osteoporosis.            Review of Systems Constitutional: no fever, chills, sweats, change in weight  Musculoskeletal:no  muscle cramps or pain Skin:no rash Neuro: no weakness; incontinence (stool/urine); numbness and tingling Heme:no lymphadenopathy; abnormal bruising or bleeding      Objective:   Physical Exam  Gen.: Healthy and well-nourished in appearance. Alert, appropriate and cooperative throughout exam.  Eyes: No conjunctivitis or inflammatory changes Neck: No deformities, masses, or tenderness noted. Range of motion excellent                            Musculoskeletal/extremities: No deformity or scoliosis noted of  the thoracic or lumbar spine. No clubbing, cyanosis, edema, or significant extremity  deformity noted. Range of motion normal .Tone & strength  normal.Joints normal . Nail health good. Able to lie down & sit up w/o help. Negative SLR bilaterally. Some pain at the base of the toe with hyperextension of the left great toe. Exquisite tenderness to deep palpation at the base of the left great toe. No tenderness over the first MCP joint to percussion. No pain with compression of the foot. No evidence clinically of  Achilles tendinitis or plantar fasciitis. Vascular:  dorsalis pedis and  posterior tibial pulses are full and equal.  Neurologic: Alert and oriented x3. Deep tendon reflexes symmetrical and normal.  Skin: Intact without suspicious lesions or rashes.  Psych: Mood and affect are normal. Normally interactive                                                                                     Assessment & Plan:  #1 foot pain at the base of the left great toe; rule out small bowel fracture. Less likely would be a neuroma.  Plan: See orders/ recommendations

## 2012-09-25 NOTE — Addendum Note (Signed)
Addended byPecola Lawless on: 09/25/2012 04:59 PM   Modules accepted: Orders

## 2012-09-25 NOTE — Patient Instructions (Addendum)
Order for x-rays entered into  the computer; these will be performed at [address] [facility]. across from [facility]. No appointment is necessary. If you activate the  My Chart system; lab & Xray results will be released directly  to you as soon as I review & address these through the computer. As per [place] all records must be reviewed and signed off within 72 hours; but I attempt to complete this within 36 hours unless I have no access to the electronic medical record.If I wait more than 36 hours the volume of reports becomes difficult to manage optimally. In my absence ;my partners would address the results.Critical lab results will be communicated to you ASAP. If you choose not to sign up for My Chart within 36 hours of labs being drawn; results will be reviewed & interpretation added before being copied & mailed, causing a delay in getting the results to you.If you do not receive that report within 7-10 days ,please call. Additionally you can use this system to gain direct  access to your records  if  out of town or @ an office of a  physician who is not in  the My Chart network.  This improves continuity of care & places you in control of your medical record.

## 2012-09-25 NOTE — Telephone Encounter (Signed)
Pt left VM requesting results of x-ray. Called Pt back advise of results.       Entered by Pecola Lawless, MD at 09/25/2012 4:57 PM Normal film without fracture or evidence of metabolic bone disease. Podiatry referral will be made.  Thank you for using My Chart; it has served [place] well. [employer]

## 2012-09-26 ENCOUNTER — Encounter: Payer: Self-pay | Admitting: Internal Medicine

## 2012-11-17 ENCOUNTER — Ambulatory Visit: Payer: 59 | Admitting: Internal Medicine

## 2012-11-18 ENCOUNTER — Ambulatory Visit (INDEPENDENT_AMBULATORY_CARE_PROVIDER_SITE_OTHER): Payer: 59 | Admitting: Internal Medicine

## 2012-11-18 ENCOUNTER — Encounter: Payer: Self-pay | Admitting: Internal Medicine

## 2012-11-18 VITALS — BP 112/76 | HR 91 | Temp 98.3°F | Resp 12 | Wt 160.0 lb

## 2012-11-18 DIAGNOSIS — H1010 Acute atopic conjunctivitis, unspecified eye: Secondary | ICD-10-CM

## 2012-11-18 DIAGNOSIS — H1013 Acute atopic conjunctivitis, bilateral: Secondary | ICD-10-CM

## 2012-11-18 DIAGNOSIS — R42 Dizziness and giddiness: Secondary | ICD-10-CM

## 2012-11-18 MED ORDER — FLUTICASONE PROPIONATE 50 MCG/ACT NA SUSP: 1.0000 | Freq: Two times a day (BID) | NASAL | Status: DC | PRN

## 2012-11-18 NOTE — Progress Notes (Signed)
  Subjective:    Patient ID: Whitney Bowman, female    DOB: 10-14-1970, 42 y.o.   MRN: [id number]  HPI Symptoms began 11/16/12 as tinnitus in the right ear without associated hearing loss  4/21 she had some dizziness and postural lightheadedness. She also developed a dull ache in the right ear without discharge  As of today she's had a dull ache in the left ear & minor frontal headache.  She's had low-grade fever 99-100F.  She's had minor cough; she also describes some sneezing and head congestion.She has had some itchy / watery eyes   She has not treated the symptoms with any prescription or over-the-counter medications.  There was no trigger for the symptoms such as flying or diving.No PMH of otic disease    Review of Systems Symptoms were not related to straining ,pain, cardiac or neurologic prodrome. She denies frank vertigo or benign positional vertigo. She is not had chills or sweats. She also denies signs of rhinosinusitis such assignificant frontal headache, facial pain, nasal purulence, dental pain, or sore throat. No blurred vision , diplopia, vision loss No palpitations, irregular rhythm, heart rate change, nausea , sweating No  numbness and tingling,limb  weakness, change in coordination (gait/falling)      Objective:   Physical Exam  Gen. appearance: Well-nourished, in no distress Eyes: Extraocular motion intact, field of vision normal, vision grossly intact, no nystagmus ENT: Canals clear, tympanic membranes normal, tuning fork exam normal, hearing grossly normal Neck: Normal range of motion, no masses Cardiovascular: Rate and rhythm normal; no murmurs, gallops or extra heart sounds Muscle skeletal:  tone, &  strength normal Neuro:Oriented X3.No cranial nerve deficit, deep tendon  reflexes normal, gait  normal. Rhomberg & finger to nose negative. Lymph: No cervical or axillary LA Skin: Warm and dry without suspicious lesions or rashes Psych: no anxiety or mood  change. Normally interactive and cooperative.            Assessment & Plan:  #1 acute tinnitus and ear pressure in the context of some extrinsic symptoms. No neurologic deficit present  #2 fever; no evidence of rhinosinusitis  Plan: See orders and recommendations

## 2012-11-18 NOTE — Patient Instructions (Addendum)
Plain Mucinex (NOT D) for thick secretions ;force NON dairy fluids .   Nasal cleansing in the shower as discussed with lather of mild shampoo.After 10 seconds wash off lather while  exhaling through nostrils. Make sure that all residual soap is removed to prevent irritation.  Fluticasone 1 spray in each nostril twice a day as needed. Use the "crossover" technique into opposite nostril spraying toward opposite ear @ 45 degree angle, not straight up into nostril.  Use a Neti pot daily only  as needed for significant sinus congestion; going from open side to congested side . Plain Allegra (NOT D )  160 daily , Loratidine 10 mg , OR Zyrtec 10 mg @ bedtime  as needed for itchy eyes & sneezing. Go to [facility] for eustachian tube dysfunction. Drink thin  fluids liberally through the day and chew sugarless gum . Do the Valsalva maneuver several times a day to "pop" ears open.  

## 2012-12-17 ENCOUNTER — Encounter: Payer: Self-pay | Admitting: Internal Medicine

## 2012-12-17 ENCOUNTER — Ambulatory Visit (INDEPENDENT_AMBULATORY_CARE_PROVIDER_SITE_OTHER): Payer: 59 | Admitting: Internal Medicine

## 2012-12-17 VITALS — BP 118/70 | HR 81 | Temp 98.3°F | Wt 158.0 lb

## 2012-12-17 DIAGNOSIS — J209 Acute bronchitis, unspecified: Secondary | ICD-10-CM

## 2012-12-17 MED ORDER — AZITHROMYCIN 250 MG PO TABS: ORAL_TABLET | ORAL | Status: DC

## 2012-12-17 MED ORDER — HYDROCODONE-HOMATROPINE 5-1.5 MG/5ML PO SYRP: 5.0000 mL | ORAL_SOLUTION | Freq: Four times a day (QID) | ORAL | Status: DC | PRN

## 2012-12-17 NOTE — Patient Instructions (Addendum)
Zicam Melts or Zinc lozenges as per package label for scratchy throat .Report persistent or progressive fever; discolored nasal  secretions; or frontal headache or facial  pain.

## 2012-12-17 NOTE — Progress Notes (Signed)
  Subjective:    Patient ID: Whitney Bowman, female    DOB: 1971/02/16, 42 y.o.   MRN: [id number]  HPI   Symptoms began 12/11/12 as a sore throat: Within 2 days she developed laryngitis and a cough. As of 5/18 the cough was productive with dark, green sputum. This was associated with some shortness of breath but no wheezing  She's had scant nasal secretions.  Extrinsic symptoms have been controlled with her Zyrtec and Flonase. With the cough she started Mucinex in attempt to clear her chest.    Review of Systems  She denies frontal headache, facial pain, persistent sore throat, otic pain, otic discharge. Also she is not having fever, chills or sweats     Objective:   Physical Exam  General appearance:good health ;well nourished; no acute distress or increased work of breathing is present.  No  lymphadenopathy about the head, neck, or axilla noted.   Eyes: No conjunctival inflammation or lid edema is present.  Ears:  External ear exam shows no significant lesions or deformities.  Otoscopic examination reveals clear canals, tympanic membranes are intact bilaterally without bulging, retraction, inflammation or discharge.  Nose:  External nasal examination shows no deformity or inflammation. Nasal mucosa are moist without lesions or exudates. No obstruction to airflow. Some sniffles  Oral exam: Dental hygiene is good; lips and gums are healthy appearing.There is no oropharyngeal erythema or exudate noted.   Neck:  No deformities,  masses, or tenderness noted.      Heart:  Normal rate and regular rhythm. S1 and S2 normal without gallop, murmur, click, rub or other extra sounds.   Lungs:Chest clear to auscultation; no wheezes, rhonchi,rales ,or rubs present.No increased work of breathing.    Extremities:  No cyanosis, edema, or clubbing  noted    Skin: Warm & dry         Assessment & Plan:  #1 bronchitis, acute without signs of rhinosinusitis.  Plan: See orders &  recommendations

## 2013-02-10 ENCOUNTER — Other Ambulatory Visit: Payer: Self-pay

## 2013-02-10 DIAGNOSIS — Z1231 Encounter for screening mammogram for malignant neoplasm of breast: Secondary | ICD-10-CM

## 2013-03-19 ENCOUNTER — Ambulatory Visit: Payer: 59

## 2013-03-26 ENCOUNTER — Ambulatory Visit
Admission: RE | Admit: 2013-03-26 | Discharge: 2013-03-26 | Disposition: A | Discharge status: Home / Self Care | Payer: 59 | Source: Ambulatory Visit

## 2013-03-26 DIAGNOSIS — Z1231 Encounter for screening mammogram for malignant neoplasm of breast: Secondary | ICD-10-CM

## 2013-05-26 ENCOUNTER — Ambulatory Visit (INDEPENDENT_AMBULATORY_CARE_PROVIDER_SITE_OTHER): Payer: 59 | Admitting: General Practice

## 2013-05-26 DIAGNOSIS — Z23 Encounter for immunization: Secondary | ICD-10-CM

## 2013-06-01 ENCOUNTER — Ambulatory Visit (INDEPENDENT_AMBULATORY_CARE_PROVIDER_SITE_OTHER): Payer: Self-pay | Admitting: General Surgery

## 2013-06-08 ENCOUNTER — Ambulatory Visit (INDEPENDENT_AMBULATORY_CARE_PROVIDER_SITE_OTHER): Payer: 59 | Admitting: General Surgery

## 2013-06-08 ENCOUNTER — Encounter (INDEPENDENT_AMBULATORY_CARE_PROVIDER_SITE_OTHER): Payer: Self-pay

## 2013-06-08 ENCOUNTER — Encounter (INDEPENDENT_AMBULATORY_CARE_PROVIDER_SITE_OTHER): Payer: Self-pay | Admitting: General Surgery

## 2013-06-08 ENCOUNTER — Other Ambulatory Visit (INDEPENDENT_AMBULATORY_CARE_PROVIDER_SITE_OTHER): Payer: Self-pay | Admitting: General Surgery

## 2013-06-08 VITALS — BP 110/70 | HR 78 | Temp 98.4°F | Resp 14 | Ht 68.0 in | Wt 151.6 lb

## 2013-06-08 DIAGNOSIS — R1033 Periumbilical pain: Secondary | ICD-10-CM

## 2013-06-08 NOTE — Progress Notes (Addendum)
Patient ID: Whitney Bowman, female   DOB: 04-26-1971, 42 y.o.   MRN: [id number]  Chief Complaint  Patient presents with  . New Evaluation    eval peri unb hernia    HPI Whitney Bowman is a 42 y.o. female.   HPI  She is referred by Dr. Cherly Hensen for further evaluation and treatment of umbilical pain and possible recurrent umbilical hernia. In July  2011, she had repair of an umbilical hernia with preperitoneal Proceed patch.    Past Medical History  Diagnosis Date  . History of positive PPD     Past Surgical History  Procedure Laterality Date  . Hernia repair      History reviewed. No pertinent family history.  Social History History  Substance Use Topics  . Smoking status: Never Smoker   . Smokeless tobacco: Never Used  . Alcohol Use: Yes     Comment: Social     Allergies  Allergen Reactions  . Cefdinir     REACTION: PROTRACTED DIARRHEA/ COLITIS  . Iodine Diarrhea and Nausea Only  . Sudafed [Pseudoephedrine Hcl]     Tachycardia  . Cephalosporins   . Codeine Nausea And Vomiting    Current Outpatient Prescriptions  Medication Sig Dispense Refill  . Multiple Vitamin (MULTIVITAMIN) tablet Take 1 tablet by mouth daily.      . Probiotic Product (ALIGN PO) Take by mouth.       No current facility-administered medications for this visit.    Review of Systems Review of Systems  Constitutional: Negative.   HENT: Negative.   Respiratory: Negative.   Cardiovascular: Negative.   Gastrointestinal: Positive for abdominal pain (at umbilical area).    Blood pressure 110/70, pulse 78, temperature 98.4 F (36.9 C), temperature source Temporal, resp. rate 14, height 5\' 8"  (1.727 m), weight 151 lb 9.6 oz (68.765 kg).  Physical Exam Physical Exam  Constitutional: She appears well-developed and well-nourished. No distress.  HENT:  Head: Normocephalic and atraumatic.  Abdominal: Soft. There is tenderness (at the umbilical area).  Subumbilical scars present. Palpable  irregular foreign body lateral to and above the umbilicus. No obvious hernia with provocative maneuvers.  Neurological: She is alert.  Skin: Skin is warm and dry.  Psychiatric: She has a normal mood and affect. Her behavior is normal.    Data Reviewed Operative note from Dr. Lindie Spruce.  Note from Dr. Cherly Hensen  Assessment    Post-umbilical hernia repair pain has been going about 6 months.  Mesh is prominent and tender on the right side. 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  No obvious recurrent hernia.     Plan    Obtain CT scan of abdomen to rule out definite recurrent hernia. Nonsteroidal twice a day. If symptoms progress  we discussed exploration and mesh removal but I could not guarantee this would get rid of all of her pain and may increase her risk for recurrent hernia. We'll discuss the results of the CT scan when I have them.        Annabelle Rexroad J 06/08/2013, 3:56 PM

## 2013-06-08 NOTE — Patient Instructions (Signed)
Try taking an Aleve twice a day.  Will call you when we get the results of the CT scan.

## 2013-06-09 ENCOUNTER — Ambulatory Visit (INDEPENDENT_AMBULATORY_CARE_PROVIDER_SITE_OTHER): Payer: 59 | Admitting: General Surgery

## 2013-06-09 ENCOUNTER — Telehealth (INDEPENDENT_AMBULATORY_CARE_PROVIDER_SITE_OTHER): Payer: Self-pay | Admitting: *Deleted

## 2013-06-09 NOTE — Telephone Encounter (Signed)
I spoke with pt on her mobile number and she stated for me to call and leave a message on her VM at home with appt info.  I LMOM for appt info for CT at [facility] on 06/12/13 with an arrival time of 8:15am.  Instructed pt to drink 1st bottle of contrast at 6:30am and 2nd bottle at 7:30am.  Instructed pt to have no solid foods after midnight.  Pt had questions regarding the contrast and her side effect to iodine which causes her instant nausea and diarrhea.  I called GI and they stated there is a water soluble contrast which will still cause the nausea and diarrhea.  They instructed for pt to go ahead and drink the contrast given in the office.

## 2013-06-12 ENCOUNTER — Ambulatory Visit
Admission: RE | Admit: 2013-06-12 | Discharge: 2013-06-12 | Disposition: A | Discharge status: Home / Self Care | Payer: 59 | Source: Ambulatory Visit | Attending: General Surgery | Admitting: General Surgery

## 2013-06-12 DIAGNOSIS — R1033 Periumbilical pain: Secondary | ICD-10-CM

## 2013-06-12 MED ORDER — IOHEXOL 300 MG/ML  SOLN
100.0000 mL | Freq: Once | INTRAMUSCULAR | Status: AC | PRN
  Administered 2013-06-12: 100 mL via INTRAVENOUS

## 2013-06-12 NOTE — Progress Notes (Signed)
Pt felt faint and chair reclined, color improved with pt flat. Pt also drank a couple swallows of coke.

## 2013-06-15 ENCOUNTER — Encounter (INDEPENDENT_AMBULATORY_CARE_PROVIDER_SITE_OTHER): Payer: Self-pay | Admitting: General Surgery

## 2013-06-15 NOTE — Progress Notes (Signed)
Patient ID: Whitney Bowman, female   DOB: 1971/07/22, 42 y.o.   MRN: [id number] I spoke with her today regarding the results of the CT scan. There is no evidence of recurrent hernia. Some scarring is evident in the area of the hernia. No obvious meshoma seen. We discussed the options once again including a expected management versus removal of the mesh, but there are no guarantees this would take care of her pain and could increase the rate of recurrent hernia. I told there might be a good idea to see Dr. Lindie Spruce And get his opinion since he put this type of mesh in. I told her that type of mesh that he put in.  She stated that she wanted to speak with her primary care physician and will get back with [place] if she would like to be seen again.

## 2013-06-30 ENCOUNTER — Ambulatory Visit (INDEPENDENT_AMBULATORY_CARE_PROVIDER_SITE_OTHER): Payer: 59 | Admitting: Internal Medicine

## 2013-06-30 ENCOUNTER — Encounter: Payer: Self-pay | Admitting: Internal Medicine

## 2013-06-30 VITALS — BP 111/73 | HR 77 | Temp 97.9°F | Ht 68.0 in | Wt 150.6 lb

## 2013-06-30 DIAGNOSIS — R1033 Periumbilical pain: Secondary | ICD-10-CM

## 2013-06-30 DIAGNOSIS — J209 Acute bronchitis, unspecified: Secondary | ICD-10-CM

## 2013-06-30 MED ORDER — AZITHROMYCIN 250 MG PO TABS: ORAL_TABLET | ORAL | Status: DC

## 2013-06-30 NOTE — Patient Instructions (Signed)

## 2013-06-30 NOTE — Progress Notes (Signed)
   Subjective:    Patient ID: Whitney Bowman, female    DOB: [birth year], 42 y.o.   MRN: [id number]  HPI     Symptoms began 11/28-11/29/14 the sore throat and a nonproductive cough. As of the evening of 11/30 the cough became productive with clear sputum. Subsequently it was yellow and as of 12/1 and was frankly green.  She has had low-grade fever up to 99-100.  Minor symptoms include itchy, watery eyes and sneezing. She's also had some pain in the forehead above the eyes.  Over-the-counter cough suppressant, Nettie pot, and Mucinex with partial benefit. She's never smoked.  Past history significant for colitis with Cefdinir. Her 42 yo has walking PNA    Review of Systems  She denies pain in the maxillary sinus area, nasal purulence, dental pain, otic pain, otic discharge  The cough is not associated with shortness of breath or wheezing.  She also has not had chills or sweats with the low-grade fever.     Objective:   Physical Exam   General appearance:good health ;well nourished; no acute distress or increased work of breathing is present.  No  lymphadenopathy about the head, neck, or axilla noted.   Eyes: No conjunctival inflammation or lid edema is present.   Ears:  External ear exam shows no significant lesions or deformities.  Otoscopic examination reveals clear canals, tympanic membranes are intact bilaterally without bulging, retraction, inflammation or discharge.  Nose:  External nasal examination shows no deformity or inflammation. Nasal mucosa are pink and moist without lesions or exudates. No septal dislocation or deviation.No obstruction to airflow.   Oral exam: Dental hygiene is good; lips and gums are healthy appearing.There is no oropharyngeal erythema or exudate noted.   Neck:  No deformities, masses, or tenderness noted.      Heart:  Normal rate and regular rhythm. S1 and S2 normal without gallop, murmur, click, rub or other extra sounds.   Lungs:Chest  clear to auscultation; no wheezes, rhonchi,rales ,or rubs present.No increased work of breathing.    Extremities:  No cyanosis, edema, or clubbing  noted    Skin: Warm & dry          Assessment & Plan:  #1 acute bronchitis w/o bronchospasm #2 rhinitis Plan: See orders and recommendations

## 2013-06-30 NOTE — Progress Notes (Signed)
Pre visit review using our clinic review tool, if applicable. No additional management support is needed unless otherwise documented below in the visit note. 

## 2013-09-24 ENCOUNTER — Ambulatory Visit: Payer: 59 | Admitting: Family Medicine

## 2013-09-28 ENCOUNTER — Ambulatory Visit (INDEPENDENT_AMBULATORY_CARE_PROVIDER_SITE_OTHER): Payer: 59 | Admitting: Family Medicine

## 2013-09-28 ENCOUNTER — Encounter: Payer: Self-pay | Admitting: Family Medicine

## 2013-09-28 VITALS — BP 110/70 | HR 74 | Temp 98.6°F | Wt 154.0 lb

## 2013-09-28 DIAGNOSIS — M654 Radial styloid tenosynovitis [de Quervain]: Secondary | ICD-10-CM

## 2013-09-28 MED ORDER — MELOXICAM 15 MG PO TABS: ORAL_TABLET | ORAL | Status: DC

## 2013-09-28 NOTE — Progress Notes (Signed)
Pre visit review using our clinic review tool, if applicable. No additional management support is needed unless otherwise documented below in the visit note. 

## 2013-09-28 NOTE — Patient Instructions (Signed)
De Quervain's Disease Harriet Pho disease is a condition often seen in racquet sports where there is a soreness (inflammation) in the cord like structures (tendons) which attach muscle to bone on the thumb side of the wrist. There may be a tightening of the tissuesaround the tendons. This condition is often helped by giving up or modifying the activity which caused it. When conservative treatment does not help, surgery may be required. Conservative treatment could include changes in the activity which brought about the problem or made it worse. Anti-inflammatory medications and injections may be used to help decrease the inflammation and help with pain control. Your caregiver will help you determine which is best for you. DIAGNOSIS  Often the diagnosis (learning what is wrong) can be made by examination. Sometimes x-rays are required. HOME CARE INSTRUCTIONS   Apply ice to the sore area for 15-20 minutes, 03-04 times per day while awake. Put the ice in a plastic bag and place a towel between the bag of ice and your skin. This is especially helpful if it can be done after all activities involving the sore wrist.  Temporary splinting may help.  Only take over-the-counter or prescription medicines for pain, discomfort or fever as directed by your caregiver. SEEK MEDICAL CARE IF:   Pain relief is not obtained with medications, or if you have increasing pain and seem to be getting worse rather than better. MAKE SURE YOU:   Understand these instructions.  Will watch your condition.  Will get help right away if you are not doing well or get worse. Document Released: 04/10/2001 Document Revised: 10/08/2011 Document Reviewed: 07/16/2005 [facility] Patient Information 2014 [place].

## 2013-09-28 NOTE — Progress Notes (Signed)
  Subjective:    Whitney Bowman is a 43 y.o. female who presents for evaluation of left hand/finger pain. Onset was gradual, starting about 2 weeks ago. The pain is moderate, worsens with movement, and is relieved by rest. There is associated numbness, tingling in fingers. Evaluation to date: none. Treatment to date: OTC analgesics.  The following portions of the patient's history were reviewed and updated as appropriate: allergies, current medications, past family history, past medical history, past social history, past surgical history and problem list.  Review of Systems Pertinent items are noted in HPI.    Objective:    BP 110/70  Pulse 74  Temp(Src) 98.6 F (37 C) (Oral)  Wt 154 lb (69.854 kg)  SpO2 98% Right hand:  normal exam, no swelling, tenderness, instability; ligaments intact, full ROM both hands, wrists, and finger joints  Left hand:  soft tissue tenderness and swelling at the 1 st cmc    Imaging: X-ray left hand: not available    Assessment:    de quervains tenosynovitis    Plan:    Natural history and expected course discussed. Questions answered. Scientist, clinical (histocompatibility and immunogenetics) distributed. Rest, ice, compression, and elevation (RICE) therapy. thumb spica splint  nsaids per orders F/u prn

## 2013-10-28 ENCOUNTER — Ambulatory Visit (INDEPENDENT_AMBULATORY_CARE_PROVIDER_SITE_OTHER): Payer: 59 | Admitting: Family Medicine

## 2013-10-28 ENCOUNTER — Encounter: Payer: Self-pay | Admitting: Family Medicine

## 2013-10-28 VITALS — BP 108/60 | HR 63 | Temp 99.0°F | Wt 156.0 lb

## 2013-10-28 DIAGNOSIS — M25539 Pain in unspecified wrist: Secondary | ICD-10-CM

## 2013-10-28 MED ORDER — MELOXICAM 15 MG PO TABS: ORAL_TABLET | ORAL | Status: DC

## 2013-10-28 NOTE — Progress Notes (Signed)
  Subjective:    Whitney Bowman is a 43 y.o. female who presents for evaluation of left hand/finger pain. Onset was before last visit. The pain is mild, worsens with movement, and is relieved by rest. There is associated numbness, tingling in fingers and wrist. Evaluation to date: none. Treatment to date: wrist splint which is not very effective.  The following portions of the patient's history were reviewed and updated as appropriate: allergies, current medications, past family history, past medical history, past social history, past surgical history and problem list.  Review of Systems Pertinent items are noted in HPI.    Objective:    BP 108/60  Pulse 63  Temp(Src) 99 F (37.2 C) (Oral)  Wt 156 lb (70.761 kg)  SpO2 99% Right hand:  normal exam, no swelling, tenderness, instability; ligaments intact, full ROM both hands, wrists, and finger joints  Left hand:  normal exam, no swelling, tenderness, instability; ligaments intact, full ROM both hands, wrists, and finger joints   Imaging: X-ray left hand: not indicated    Assessment:    hand/wrist pain/ numbness---? CTS    Plan:    Natural history and expected course discussed. Questions answered. Scientist, clinical (histocompatibility and immunogenetics) distributed. Rest, ice, compression, and elevation (RICE) therapy. NSAIDs per medication orders. Hand surgeon referral.

## 2013-10-28 NOTE — Patient Instructions (Signed)
Carpal Tunnel Release Carpal tunnel release is done to relieve the pressure on the nerves and tendons on the bottom side of your wrist.  LET YOUR CAREGIVER KNOW ABOUT:   Allergies to food or medicine.  Medicines taken, including vitamins, herbs, eyedrops, over-the-counter medicines, and creams.  Use of steroids (by mouth or creams).  Previous problems with anesthetics or numbing medicines.  History of bleeding problems or blood clots.  Previous surgery.  Other health problems, including diabetes and kidney problems.  Possibility of pregnancy, if this applies. RISKS AND COMPLICATIONS  Some problems that may happen after this procedure include:  Infection.  Damage to the nerves, arteries or tendons could occur. This would be very uncommon.  Bleeding. BEFORE THE PROCEDURE   This surgery may be done while you are asleep (general anesthetic) or may be done under a block where only your forearm and the surgical area is numb.  If the surgery is done under a block, the numbness will gradually wear off within several hours after surgery. HOME CARE INSTRUCTIONS   Have a responsible person with you for 24 hours.  Do not drive a car or use public transportation for 24 hours.  Only take over-the-counter or prescription medicines for pain, discomfort, or fever as directed by your caregiver. Take them as directed.  You may put ice on the palm side of the affected wrist.  Put ice in a plastic bag.  Place a towel between your skin and the bag.  Leave the ice on for 20 to 30 minutes, 4 times per day.  If you were given a splint to keep your wrist from bending, use it as directed. It is important to wear the splint at night or as directed. Use the splint for as long as you have pain or numbness in your hand, arm, or wrist. This may take 1 to 2 months.  Keep your hand raised (elevated) above the level of your heart as much as possible. This keeps swelling down and helps with  discomfort.  Change bandages (dressings) as directed.  Keep the wound clean and dry. SEEK MEDICAL CARE IF:   You develop pain not relieved with medications.  You develop numbness of your hand.  You develop bleeding from your surgical site.  You have an oral temperature above 102 F (38.9 C).  You develop redness or swelling of the surgical site.  You develop new, unexplained problems. SEEK IMMEDIATE MEDICAL CARE IF:   You develop a rash.  You have difficulty breathing.  You develop any reaction or side effects to medications given. Document Released: 10/06/2003 Document Revised: 10/08/2011 Document Reviewed: 05/22/2007 [facility] Patient Information 2014 [place].

## 2013-10-28 NOTE — Progress Notes (Signed)
Pre visit review using our clinic review tool, if applicable. No additional management support is needed unless otherwise documented below in the visit note. 

## 2013-11-11 ENCOUNTER — Encounter: Payer: Self-pay | Admitting: General Surgery

## 2013-11-11 DIAGNOSIS — R002 Palpitations: Secondary | ICD-10-CM | POA: Insufficient documentation

## 2013-11-13 ENCOUNTER — Ambulatory Visit (INDEPENDENT_AMBULATORY_CARE_PROVIDER_SITE_OTHER): Payer: 59 | Admitting: Cardiology

## 2013-11-13 ENCOUNTER — Encounter: Payer: Self-pay | Admitting: Cardiology

## 2013-11-13 VITALS — BP 123/80 | HR 73 | Ht 68.0 in | Wt 154.4 lb

## 2013-11-13 DIAGNOSIS — R002 Palpitations: Secondary | ICD-10-CM

## 2013-11-13 DIAGNOSIS — R079 Chest pain, unspecified: Secondary | ICD-10-CM | POA: Insufficient documentation

## 2013-11-13 NOTE — Progress Notes (Signed)
[place], [place], [place]  [address] Phone: [phone] Fax:  [phone]  Date:  11/13/2013   ID:  VERTA RIEDLINGER, DOB 1971-01-28, MRN [id number]  PCP:  Unice Cobble, MD  Cardiologist:  Fransico Him, MD     History of Present Illness: Whitney Bowman is a 43 y.o. female with a history of palpitations in the past with sinus tachycardia noted on heart monitor.  I have not seen her since 2010 and her palpitations have been under good control and only occur in the setting of sleep deprivation . She is here today because she has been having some chest pain.  She describes it as a dull ache with pressure located in the midsternal area with no radiation.  SHe has had about 8 episodes between January and mid March and has not had any since then.  She says that it is nonexertional and usually lasts around 2-3 intermittently.  She denies any diaphoresis or nausea but feels like it is hard to breath and that there is an elephant sitting on her chest.  She denies any DOE.  She does not get any aerobic exercise.  She denies any LE edema, dizziness or palpitations.   Wt Readings from Last 3 Encounters:  11/13/13 154 lb 6.4 oz (70.035 kg)  10/28/13 156 lb (70.761 kg)  09/28/13 154 lb (69.854 kg)     Past Medical History  Diagnosis Date  . History of positive PPD   . Palpitations     Current Outpatient Prescriptions  Medication Sig Dispense Refill  . meloxicam (MOBIC) 15 MG tablet 1/2-1 tab po qd prn pain  30 tablet  0  . Multiple Vitamin (MULTIVITAMIN) tablet Take 1 tablet by mouth daily.      . Probiotic Product (ALIGN PO) Take by mouth.       No current facility-administered medications for this visit.    Allergies:    Allergies  Allergen Reactions  . Cefdinir     REACTION: PROTRACTED DIARRHEA/ COLITIS  . Shellfish Allergy Diarrhea and Nausea Only  . Sudafed [Pseudoephedrine Hcl]     Tachycardia  . Cephalosporins   . Codeine Nausea And Vomiting    Social  History:  The patient  reports that she has never smoked. She has never used smokeless tobacco. She reports that she drinks alcohol. She reports that she does not use illicit drugs.   Family History:  The patient's family history includes Hypertension in her father and mother.   ROS:  Please see the history of present illness.      All other systems reviewed and negative.   PHYSICAL EXAM: VS:  BP 123/80  Pulse 73  Ht 5' 8"  (1.727 m)  Wt 154 lb 6.4 oz (70.035 kg)  BMI 23.48 kg/m2 Well nourished, well developed, in no acute distress HEENT: normal Neck: no JVD Cardiac:  normal S1, S2; RRR; no murmur Lungs:  clear to auscultation bilaterally, no wheezing, rhonchi or rales Abd: soft, nontender, no hepatomegaly Ext: no edema Skin: warm and dry Neuro:  CNs 2-12 intact, no focal abnormalities noted  EKG:  NSR with no ST changes     ASSESSMENT AND PLAN:  1. Chest pain with no ischemia on EKG.  She really does not have any cardiac risk factors. - check ETT to rule out ischemia - check 2D echo to assess LVF 2. Palpitations that are intermittent and caused by sleep deprivation  Followup with me PRN  Signed, Fransico Him,  MD 11/13/2013 8:26 AM

## 2013-11-13 NOTE — Patient Instructions (Signed)
Your physician recommends that you continue on your current medications as directed. Please refer to the Current Medication list given to you today.  Your physician has requested that you have an echocardiogram. Echocardiography is a painless test that uses sound waves to create images of your heart. It provides your doctor with information about the size and shape of your heart and how well your heart's chambers and valves are working. This procedure takes approximately one hour. There are no restrictions for this procedure.  Your physician has requested that you have an exercise tolerance test. For further information please visit [email or web address]. Please also follow instruction sheet, as given.  Your physician recommends that you schedule a follow-up appointment as needed with Dr Radford Pax

## 2013-12-22 ENCOUNTER — Ambulatory Visit (INDEPENDENT_AMBULATORY_CARE_PROVIDER_SITE_OTHER): Payer: 59 | Admitting: Nurse Practitioner

## 2013-12-22 ENCOUNTER — Ambulatory Visit (HOSPITAL_COMMUNITY): Payer: 59 | Attending: Cardiology | Admitting: Radiology

## 2013-12-22 ENCOUNTER — Encounter: Payer: Self-pay | Admitting: Nurse Practitioner

## 2013-12-22 VITALS — BP 131/83 | HR 100

## 2013-12-22 DIAGNOSIS — R079 Chest pain, unspecified: Secondary | ICD-10-CM

## 2013-12-22 DIAGNOSIS — R002 Palpitations: Secondary | ICD-10-CM

## 2013-12-22 NOTE — Progress Notes (Signed)
Exercise Treadmill Test  Pre-Exercise Testing Evaluation Rhythm: normal sinus  Rate: 85 bpm     Test  Exercise Tolerance Test Ordering MD: Fransico Him, MD  Interpreting MD: Truitt Merle, NP  Unique Test No: 1  Treadmill:  1  Indication for ETT: chest pain - rule out ischemia  Contraindication to ETT: No   Stress Modality: exercise - treadmill  Cardiac Imaging Performed: non   Protocol: standard Bruce - maximal  Max BP:  153/96  Max MPHR (bpm):  179 85% MPR (bpm):  152  MPHR obtained (bpm):  166 % MPHR obtained:  92%  Reached 85% MPHR (min:sec):  8:13 Total Exercise Time (min-sec):  9:30  Workload in METS:  10.9 Borg Scale: 15  Reason ETT Terminated:  patient's desire to stop    ST Segment Analysis At Rest: normal ST segments - no evidence of significant ST depression With Exercise: no evidence of significant ST depression  Other Information Arrhythmia:  No Angina during ETT:  absent (0) Quality of ETT:  diagnostic  ETT Interpretation:  normal - no evidence of ischemia by ST analysis  Comments: Patient presents today for routine GXT. Has had atypical chest pain and palpitations.    Today the patient exercised on the standard Bruce protocol for a total of 9:30 minutes.  Good exercise tolerance.  Adequate blood pressure response.  Clinically negative for chest pain. Test was stopped due to achievement of target HR.  EKG negative for ischemia. No significant arrhythmia noted.   Recommendations: CV risk factor modification.  See back prn.   Patient is agreeable to this plan and will call if any problems develop in the interim.   Burtis Junes, RN, [place] [address] [place], [place]  [address] [phone]

## 2013-12-22 NOTE — Progress Notes (Signed)
Echocardiogram performed.  

## 2014-02-22 ENCOUNTER — Other Ambulatory Visit: Payer: Self-pay

## 2014-02-22 DIAGNOSIS — Z1231 Encounter for screening mammogram for malignant neoplasm of breast: Secondary | ICD-10-CM

## 2014-03-29 ENCOUNTER — Ambulatory Visit
Admission: RE | Admit: 2014-03-29 | Discharge: 2014-03-29 | Disposition: A | Discharge status: Home / Self Care | Payer: 59 | Source: Ambulatory Visit

## 2014-03-29 DIAGNOSIS — Z1231 Encounter for screening mammogram for malignant neoplasm of breast: Secondary | ICD-10-CM

## 2014-05-10 ENCOUNTER — Ambulatory Visit (INDEPENDENT_AMBULATORY_CARE_PROVIDER_SITE_OTHER): Payer: 59

## 2014-05-10 DIAGNOSIS — Z23 Encounter for immunization: Secondary | ICD-10-CM

## 2014-06-08 ENCOUNTER — Encounter: Payer: Self-pay | Admitting: Internal Medicine

## 2014-06-08 ENCOUNTER — Telehealth: Payer: Self-pay | Admitting: *Deleted

## 2014-06-08 ENCOUNTER — Ambulatory Visit (INDEPENDENT_AMBULATORY_CARE_PROVIDER_SITE_OTHER): Payer: 59 | Admitting: Internal Medicine

## 2014-06-08 ENCOUNTER — Ambulatory Visit: Payer: 59 | Admitting: Family Medicine

## 2014-06-08 VITALS — BP 102/62 | HR 88 | Temp 98.7°F | Wt 155.1 lb

## 2014-06-08 DIAGNOSIS — J209 Acute bronchitis, unspecified: Secondary | ICD-10-CM | POA: Insufficient documentation

## 2014-06-08 DIAGNOSIS — J208 Acute bronchitis due to other specified organisms: Secondary | ICD-10-CM

## 2014-06-08 MED ORDER — LEVOFLOXACIN 250 MG PO TABS: 250.0000 mg | ORAL_TABLET | Freq: Every day | ORAL | Status: DC

## 2014-06-08 MED ORDER — HYDROCODONE-HOMATROPINE 5-1.5 MG/5ML PO SYRP: 5.0000 mL | ORAL_SOLUTION | Freq: Four times a day (QID) | ORAL | Status: DC | PRN

## 2014-06-08 NOTE — Assessment & Plan Note (Signed)
Mild to mod, for antibx course,  to f/u any worsening symptoms or concerns 

## 2014-06-08 NOTE — Progress Notes (Signed)
   Subjective:    Patient ID: Whitney Bowman, female    DOB: 01-24-1971, 43 y.o.   MRN: [id number]  HPI  Here with acute onset mild to mod 2-3 days ST, HA, general weakness and malaise, with prod cough greenish sputum, but Pt denies chest pain, increased sob or doe, wheezing, orthopnea, PND, increased LE swelling, palpitations, dizziness or syncope.  Past Medical History  Diagnosis Date  . History of positive PPD   . Palpitations    Past Surgical History  Procedure Laterality Date  . Hernia repair      reports that she has never smoked. She has never used smokeless tobacco. She reports that she drinks alcohol. She reports that she does not use illicit drugs. family history includes Hypertension in her father and mother. Allergies  Allergen Reactions  . Cefdinir     REACTION: PROTRACTED DIARRHEA/ COLITIS  . Shellfish Allergy Diarrhea and Nausea Only  . Sudafed [Pseudoephedrine Hcl]     Tachycardia  . Cephalosporins   . Codeine Nausea And Vomiting   Current Outpatient Prescriptions on File Prior to Visit  Medication Sig Dispense Refill  . meloxicam (MOBIC) 15 MG tablet 1/2-1 tab po qd prn pain 30 tablet 0  . Multiple Vitamin (MULTIVITAMIN) tablet Take 1 tablet by mouth daily.    . Probiotic Product (ALIGN PO) Take by mouth.     No current facility-administered medications on file prior to visit.   Review of Systems All otherwise neg per pt     Objective:   Physical Exam BP 102/62 mmHg  Pulse 88  Temp(Src) 98.7 F (37.1 C) (Oral)  Wt 155 lb 2 oz (70.364 kg)  SpO2 97% VS noted,  Constitutional: Pt appears well-developed, well-nourished.  HENT: Head: NCAT.  Right Ear: External ear normal.  Left Ear: External ear normal.  Eyes: . Pupils are equal, round, and reactive to light. Conjunctivae and EOM are normal Bilat tm's with mild erythema.  Max sinus areas non tender.  Pharynx with mild erythema, no exudate Neck: Normal range of motion. Neck supple.  Cardiovascular:  Normal rate and regular rhythm.   Pulmonary/Chest: Effort normal and breath sounds normal. - no rales or wheezing Neurological: Pt is alert. Not confused , motor grossly intact Skin: Skin is warm. No rash Psychiatric: Pt behavior is normal. No agitation.     Assessment & Plan:

## 2014-06-08 NOTE — Progress Notes (Signed)
Pre visit review using our clinic review tool, if applicable. No additional management support is needed unless otherwise documented below in the visit note. 

## 2014-06-08 NOTE — Patient Instructions (Signed)
Please take all new medication as prescribed - the antibiotic, and cough medicine  Please continue all other medications as before, and refills have been done if requested.  Please have the pharmacy call with any other refills you may need.  Please keep your appointments with your specialists as you may have planned

## 2014-06-08 NOTE — Telephone Encounter (Signed)
Pt did not show for appointment 06/08/2014 at 6pm for poss bronchitis.  [facility] office called at 6:08 stating pt went to [facility] then went to [facility].  Pt was at [facility] when they called. Informed that pt would have to reschedule. Tammy at [facility] stated she would see if Dr. Jenny Reichmann could see her, and if not she would have pt call us to reschedule.

## 2014-06-08 NOTE — Telephone Encounter (Signed)
charge 

## 2014-06-11 NOTE — Telephone Encounter (Signed)
See note below

## 2014-08-07 NOTE — Addendum Note (Signed)
Addended by: Biagio Borg on: 08/07/2014 09:03 PM   Modules accepted: Miquel Dunn

## 2015-03-01 ENCOUNTER — Other Ambulatory Visit: Payer: Self-pay

## 2015-03-01 DIAGNOSIS — Z1231 Encounter for screening mammogram for malignant neoplasm of breast: Secondary | ICD-10-CM

## 2015-04-13 ENCOUNTER — Ambulatory Visit
Admission: RE | Admit: 2015-04-13 | Discharge: 2015-04-13 | Disposition: A | Discharge status: Home / Self Care | Payer: Commercial Managed Care - HMO | Source: Ambulatory Visit

## 2015-04-13 DIAGNOSIS — Z1231 Encounter for screening mammogram for malignant neoplasm of breast: Secondary | ICD-10-CM

## 2015-05-13 ENCOUNTER — Ambulatory Visit: Payer: Commercial Managed Care - HMO

## 2015-05-20 ENCOUNTER — Encounter: Payer: Self-pay | Admitting: Emergency Medicine

## 2015-05-20 ENCOUNTER — Ambulatory Visit (INDEPENDENT_AMBULATORY_CARE_PROVIDER_SITE_OTHER): Payer: Commercial Managed Care - HMO | Admitting: Emergency Medicine

## 2015-05-20 DIAGNOSIS — Z23 Encounter for immunization: Secondary | ICD-10-CM

## 2016-03-05 ENCOUNTER — Other Ambulatory Visit: Payer: Self-pay | Admitting: Obstetrics and Gynecology

## 2016-03-05 DIAGNOSIS — Z1231 Encounter for screening mammogram for malignant neoplasm of breast: Secondary | ICD-10-CM

## 2016-04-16 ENCOUNTER — Ambulatory Visit
Admission: RE | Admit: 2016-04-16 | Discharge: 2016-04-16 | Disposition: A | Discharge status: Home / Self Care | Payer: Commercial Managed Care - HMO | Source: Ambulatory Visit | Attending: Obstetrics and Gynecology | Admitting: Obstetrics and Gynecology

## 2016-04-16 DIAGNOSIS — Z1231 Encounter for screening mammogram for malignant neoplasm of breast: Secondary | ICD-10-CM

## 2016-06-14 ENCOUNTER — Telehealth: Payer: Self-pay | Admitting: Internal Medicine

## 2016-06-14 NOTE — Telephone Encounter (Signed)
Whitney Bowman was a patient of Dr. Darrol Angel. She is wanting to tranfers care over to Dr. Sharlet Salina. Please advise. Thank you.

## 2016-06-14 NOTE — Telephone Encounter (Signed)
Unfortunately I am not taking new patients as this time.

## 2016-06-14 NOTE — Telephone Encounter (Signed)
No answer LVM. Patient informed Dr. Sharlet Salina can no take on anymore patients. To call back and we can look at sitting her up with a NP.

## 2016-06-19 ENCOUNTER — Ambulatory Visit (INDEPENDENT_AMBULATORY_CARE_PROVIDER_SITE_OTHER): Payer: Commercial Managed Care - HMO

## 2016-06-19 DIAGNOSIS — Z23 Encounter for immunization: Secondary | ICD-10-CM | POA: Diagnosis not present

## 2016-07-02 ENCOUNTER — Ambulatory Visit (INDEPENDENT_AMBULATORY_CARE_PROVIDER_SITE_OTHER): Payer: Commercial Managed Care - HMO | Admitting: Medical

## 2016-07-02 ENCOUNTER — Encounter: Payer: Self-pay | Admitting: Medical

## 2016-07-02 VITALS — BP 100/68 | HR 85 | Temp 98.6°F | Ht 68.0 in | Wt 152.4 lb

## 2016-07-02 DIAGNOSIS — R05 Cough: Secondary | ICD-10-CM

## 2016-07-02 DIAGNOSIS — R0981 Nasal congestion: Secondary | ICD-10-CM

## 2016-07-02 DIAGNOSIS — J209 Acute bronchitis, unspecified: Secondary | ICD-10-CM

## 2016-07-02 DIAGNOSIS — R059 Cough, unspecified: Secondary | ICD-10-CM

## 2016-07-02 MED ORDER — BENZONATATE 100 MG PO CAPS: 100.0000 mg | ORAL_CAPSULE | Freq: Three times a day (TID) | ORAL | 0 refills | Status: DC | PRN

## 2016-07-02 MED ORDER — AZITHROMYCIN 250 MG PO TABS: ORAL_TABLET | ORAL | 0 refills | Status: DC

## 2016-07-02 MED ORDER — FLUTICASONE PROPIONATE 50 MCG/ACT NA SUSP: 2.0000 | Freq: Every day | NASAL | 1 refills | Status: DC

## 2016-07-02 NOTE — Patient Instructions (Addendum)
You appear to have bronchitis. Rest hydrate and tylenol for fever. I am prescribing cough medicine benzonatate, and azithromycin antibiotic. For your nasal congestion you could use flonase.   If your symptoms persist or worsen let us know. Would need chest xray in that event.  Follow up in 7-10 days or as needed

## 2016-07-02 NOTE — Progress Notes (Signed)
Subjective:    Patient ID: Whitney Bowman, female    DOB: November 02, 1970, 45 y.o.   MRN: [id number]  HPI  Pt in for recent illness.   Pt got ill Saturday after thanskgiving. Little runny nose and nasal congestion. Lingering for 2 weeks then past 4 days got chest congestion. Now when coughs will bring up some mucous. No chills or sweats. Maybe some fever.   Tried netti-pot and mucinex.  No sinus pain.   Review of Systems  Constitutional: Negative for chills, fatigue and fever.  HENT: Positive for congestion and sinus pressure. Negative for postnasal drip, rhinorrhea, sinus pain and sneezing.   Respiratory: Positive for cough. Negative for chest tightness, shortness of breath and wheezing.   Cardiovascular: Negative for chest pain and palpitations.  Gastrointestinal: Negative for abdominal pain.  Musculoskeletal: Negative for back pain.  Skin: Negative for rash.  Neurological: Negative for dizziness and headaches.  Hematological: Negative for adenopathy. Does not bruise/bleed easily.  Psychiatric/Behavioral: Negative for behavioral problems, sleep disturbance and suicidal ideas. The patient is not nervous/anxious.      Past Medical History:  Diagnosis Date  . History of positive PPD   . Palpitations      Social History   Social History  . Marital status: Married    Spouse name: N/A  . Number of children: N/A  . Years of education: N/A   Occupational History  . Not on file.   Social History Main Topics  . Smoking status: Never Smoker  . Smokeless tobacco: Never Used  . Alcohol use Yes     Comment: Social   . Drug use: No  . Sexual activity: Not on file   Other Topics Concern  . Not on file   Social History Narrative  . No narrative on file    Past Surgical History:  Procedure Laterality Date  . HERNIA REPAIR      Family History  Problem Relation Age of Onset  . Hypertension Mother   . Hypertension Father     Allergies  Allergen Reactions  .  Cefdinir     REACTION: PROTRACTED DIARRHEA/ COLITIS  . Shellfish Allergy Diarrhea and Nausea Only  . Sudafed [Pseudoephedrine Hcl]     Tachycardia  . Cephalosporins   . Codeine Nausea And Vomiting    Current Outpatient Prescriptions on File Prior to Visit  Medication Sig Dispense Refill  . Multiple Vitamin (MULTIVITAMIN) tablet Take 1 tablet by mouth daily.    . Probiotic Product (ALIGN PO) Take by mouth.     No current facility-administered medications on file prior to visit.     BP 100/68 (BP Location: Left Arm, Patient Position: Sitting, Cuff Size: Normal)   Pulse 85   Temp 98.6 F (37 C) (Oral)   Ht 5' 8"  (1.727 m)   Wt 152 lb 6.4 oz (69.1 kg)   LMP 06/27/2016   SpO2 98%   BMI 23.17 kg/m        Objective:   Physical Exam   General  Mental Status - Alert. General Appearance - Well groomed. Not in acute distress.  Skin Rashes- No Rashes.  HEENT Head- Normal. Ear Auditory Canal - Left- Normal. Right - Normal.Tympanic Membrane- Left- Normal. Right- Normal. Eye Sclera/Conjunctiva- Left- Normal. Right- Normal. Nose & Sinuses Nasal Mucosa- Left-  Boggy and Congested. Right-  Boggy and  Congested.Bilateral no maxillary and no frontal sinus pressure. Mouth & Throat Lips: Upper Lip- Normal: no dryness, cracking, pallor, cyanosis, or vesicular eruption. Lower  Lip-Normal: no dryness, cracking, pallor, cyanosis or vesicular eruption. Buccal Mucosa- Bilateral- No Aphthous ulcers. Oropharynx- No Discharge or Erythema. Tonsils: Characteristics- Bilateral- No Erythema or Congestion. Size/Enlargement- Bilateral- No enlargement. Discharge- bilateral-None.  Neck Neck- Supple. No Masses.   Chest and Lung Exam Auscultation: Breath Sounds:-Clear even and unlabored.  Cardiovascular Auscultation:Rythm- Regular, rate and rhythm. Murmurs & Other Heart Sounds:Ausculatation of the heart reveal- No Murmurs.  Lymphatic Head & Neck General Head & Neck Lymphatics: Bilateral:  Description- No Localized lymphadenopathy.        Assessment & Plan:  You appear to have bronchitis. Rest hydrate and tylenol for fever. I am prescribing cough medicine benzonatate, and azithromycin antibiotic. For your nasal congestion you could use flonase.   If your symptoms persist or worsen let us know. Would need chest xray in that event.  Follow up in 7-10 days or as needed

## 2016-07-02 NOTE — Progress Notes (Signed)
Pre visit review using our clinic review tool, if applicable. No additional management support is needed unless otherwise documented below in the visit note. 

## 2016-08-16 ENCOUNTER — Ambulatory Visit: Payer: Commercial Managed Care - HMO | Admitting: Family Medicine

## 2016-09-28 DIAGNOSIS — Z6823 Body mass index (BMI) 23.0-23.9, adult: Secondary | ICD-10-CM | POA: Diagnosis not present

## 2016-09-28 DIAGNOSIS — Z01419 Encounter for gynecological examination (general) (routine) without abnormal findings: Secondary | ICD-10-CM | POA: Diagnosis not present

## 2016-10-02 ENCOUNTER — Telehealth: Payer: Self-pay

## 2016-10-02 NOTE — Telephone Encounter (Signed)
Pre visit call completed 

## 2016-10-03 ENCOUNTER — Encounter: Payer: Self-pay | Admitting: Family Medicine

## 2016-10-03 ENCOUNTER — Ambulatory Visit (INDEPENDENT_AMBULATORY_CARE_PROVIDER_SITE_OTHER): Payer: Commercial Managed Care - HMO | Admitting: Family Medicine

## 2016-10-03 VITALS — BP 102/72 | HR 75 | Temp 98.1°F | Ht 68.0 in | Wt 151.4 lb

## 2016-10-03 DIAGNOSIS — Z1329 Encounter for screening for other suspected endocrine disorder: Secondary | ICD-10-CM | POA: Diagnosis not present

## 2016-10-03 DIAGNOSIS — Z8249 Family history of ischemic heart disease and other diseases of the circulatory system: Secondary | ICD-10-CM

## 2016-10-03 DIAGNOSIS — Z13220 Encounter for screening for lipoid disorders: Secondary | ICD-10-CM

## 2016-10-03 DIAGNOSIS — Z131 Encounter for screening for diabetes mellitus: Secondary | ICD-10-CM

## 2016-10-03 DIAGNOSIS — Z130 Encounter for screening for diseases of the blood and blood-forming organs and certain disorders involving the immune mechanism: Secondary | ICD-10-CM

## 2016-10-03 DIAGNOSIS — Z0000 Encounter for general adult medical examination without abnormal findings: Secondary | ICD-10-CM

## 2016-10-03 DIAGNOSIS — R7989 Other specified abnormal findings of blood chemistry: Secondary | ICD-10-CM

## 2016-10-03 LAB — CBC
HCT: 40.9 % (ref 36.0–46.0)
Hemoglobin: 13.8 g/dL (ref 12.0–15.0)
MCHC: 33.8 g/dL (ref 30.0–36.0)
MCV: 88.5 fl (ref 78.0–100.0)
PLATELETS: 301 10*3/uL (ref 150.0–400.0)
RBC: 4.62 Mil/uL (ref 3.87–5.11)
RDW: 14.2 % (ref 11.5–15.5)
WBC: 5.2 10*3/uL (ref 4.0–10.5)

## 2016-10-03 LAB — COMPREHENSIVE METABOLIC PANEL
ALT: 11 U/L (ref 0–35)
AST: 15 U/L (ref 0–37)
Albumin: 4.5 g/dL (ref 3.5–5.2)
Alkaline Phosphatase: 66 U/L (ref 39–117)
BUN: 14 mg/dL (ref 6–23)
CALCIUM: 9.8 mg/dL (ref 8.4–10.5)
CHLORIDE: 102 meq/L (ref 96–112)
CO2: 27 meq/L (ref 19–32)
Creatinine, Ser: 0.73 mg/dL (ref 0.40–1.20)
GFR: 91.77 mL/min (ref >60.00)
Glucose, Bld: 94 mg/dL (ref 70–99)
Potassium: 4.6 mEq/L (ref 3.5–5.1)
SODIUM: 136 meq/L (ref 135–145)
Total Bilirubin: 0.6 mg/dL (ref 0.2–1.2)
Total Protein: 7.5 g/dL (ref 6.0–8.3)

## 2016-10-03 LAB — LIPID PANEL
CHOL/HDL RATIO: 3
Cholesterol: 210 mg/dL — ABNORMAL HIGH (ref 0–200)
HDL: 79 mg/dL (ref >39.00)
LDL Cholesterol: 109 mg/dL — ABNORMAL HIGH (ref 0–99)
NONHDL: 130.62
Triglycerides: 109 mg/dL (ref 0.0–149.0)
VLDL: 21.8 mg/dL (ref 0.0–40.0)

## 2016-10-03 LAB — HEMOGLOBIN A1C: Hgb A1c MFr Bld: 5.3 % (ref 4.6–6.5)

## 2016-10-03 LAB — TSH

## 2016-10-03 NOTE — Patient Instructions (Signed)
It was a pleasure to see you today-  I will be in touch with your labs asap Continue to take good care of yourself.  I would encourage you to try and get about 30 minutes of moderate physical activity (does not have to be done all at once!) most days of the meet to keep yourself healthy Take care!

## 2016-10-03 NOTE — Progress Notes (Signed)
Pre visit review using our clinic review tool, if applicable. No additional management support is needed unless otherwise documented below in the visit note.  Cholesterol

## 2016-10-03 NOTE — Progress Notes (Addendum)
[place] at [facility] [address] [place], [place] [address] [phone]  Date:  10/03/2016   Name:  Whitney Bowman   DOB:  10-24-1970   MRN:  [id number]  PCP:  Lamar Blinks, MD    Chief Complaint: Transfer of care (Former Dr. Linna Darner. Pt states cholesterol will need to be checked, fasting today for labs. )   History of Present Illness:  Whitney Bowman is a 46 y.o. very pleasant female patient who presents with the following:  Here today to establish care with me today- her previous PCP Dr. Linna Darner has retired.   She is generally in good health-  History of palpitations and hernia repair.  She developed another hernia right after she had the repair She did see cardiology a couple of year ago; palpitations and CP. She had a treadmill that was reassuring mammo is UTD Last pap: 10 days ago  She sees Dr. Garwin Brothers, had a pap a couple of weeks ago  She will have palpitations with sleep deprivation, stress, sometimes after eating.    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  Her oldest has autism  Her parents both have HTN on medication, her father is on cholesterol med as well Melanoma runs in her family. She does see derm for regular skin checks.  She also has a history of inverse psoriasis but has never had skin cancer. She sees Dr. Fontaine No at [facility] derm She does exercise maybe twice a week at the [facility] Readings from Last 3 Encounters:  10/03/16 151 lb 6.4 oz (68.7 kg)  07/02/16 152 lb 6.4 oz (69.1 kg)  06/08/14 155 lb 2 oz (70.4 kg)      Patient Active Problem List   Diagnosis Date Noted  . Acute bronchitis 06/08/2014  . Chest pain 11/13/2013  . Palpitations 11/11/2013  . Umbilical pain 76/72/0947  . CAP (community acquired pneumonia)  08/22/2012  . 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 History of positive PPD   . Palpitations     Past Surgical History:  Procedure Laterality Date  . HERNIA REPAIR      Social History  Substance Use Topics  . Smoking status: Never Smoker  . Smokeless tobacco: Never Used  . Alcohol use Yes     Comment: Social     Family History  Problem Relation Age of Onset  . Hypertension Mother   . Hypertension Father     Allergies  Allergen Reactions  . Cefdinir     REACTION: PROTRACTED DIARRHEA/ COLITIS  . Shellfish Allergy Diarrhea and Nausea Only  . Sudafed [Pseudoephedrine Hcl]     Tachycardia  . Cephalosporins   . Codeine Nausea And Vomiting    Medication list has been reviewed and updated.  Current Outpatient Prescriptions on File Prior to Visit  Medication Sig Dispense Refill  . cyclobenzaprine (FLEXERIL) 5 MG tablet Take 5 mg by mouth as needed (muscle spasms/cramps).     . Multiple Vitamin (MULTIVITAMIN) tablet Take 1 tablet by mouth daily.    . Probiotic Product (ALIGN PO) Take by mouth.     No current facility-administered medications on file prior to visit.  Review of Systems:  As per HPI- otherwise negative.   Physical Examination: Vitals:   10/03/16 0826  BP: 102/72  Pulse: 75  Temp: 98.1 F (36.7 C)   Vitals:   10/03/16 0826  Weight: 151 lb 6.4 oz (68.7 kg)  Height: 5' 8"  (1.727 m)   Body mass index is 23.02 kg/m. Ideal Body Weight: Weight in (lb) to have BMI = 25: 164.1  GEN: WDWN, NAD, Non-toxic, A & O x 3, looks well, normal weight HEENT: Atraumatic, Normocephalic. Neck supple. No masses, No LAD.  eustachian tube dysfunctio Ears and Nose: No external deformity. CV: RRR, No M/G/R. No JVD. No thrill. No extra heart sounds. PULM: CTA B, no wheezes, crackles, rhonchi. No retractions. No resp. distress. No accessory muscle use. ABD: S, NT, ND, +BS. No rebound. No HSM.  S/p  umbilical hernia repair EXTR: No c/c/e NEURO Normal gait.  PSYCH: Normally interactive. Conversant. Not depressed or anxious appearing.  Calm demeanor.    Assessment and Plan: Physical exam  Screening for diabetes mellitus - Plan: Comprehensive metabolic panel, Hemoglobin A1c  Screening for hyperlipidemia - Plan: Lipid panel  Family history of hypertension  Screening for thyroid disorder - Plan: TSH  Screening for deficiency anemia - Plan: CBC  Here today for a CPE- will obtain labs as above Encouraged exercise Will plan further follow- up pending labs. She will continue to see derm on a regular basis for screening   Signed Lamar Blinks, MD  Received her labs and sent me a mychart message  Results for orders placed or performed in visit on 10/03/16  CBC  Result Value Ref Range   WBC 5.2 4.0 - 10.5 K/uL   RBC 4.62 3.87 - 5.11 Mil/uL   Platelets 301.0 150.0 - 400.0 K/uL   Hemoglobin 13.8 12.0 - 15.0 g/dL   HCT 40.9 36.0 - 46.0 %   MCV 88.5 78.0 - 100.0 fl   MCHC 33.8 30.0 - 36.0 g/dL   RDW 14.2 11.5 - 15.5 %  Comprehensive metabolic panel  Result Value Ref Range   Sodium 136 135 - 145 mEq/L   Potassium 4.6 3.5 - 5.1 mEq/L   Chloride 102 96 - 112 mEq/L   CO2 27 19 - 32 mEq/L   Glucose, Bld 94 70 - 99 mg/dL   BUN 14 6 - 23 mg/dL   Creatinine, Ser 0.73 0.40 - 1.20 mg/dL   Total Bilirubin 0.6 0.2 - 1.2 mg/dL   Alkaline Phosphatase 66 39 - 117 U/L   AST 15 0 - 37 U/L   ALT 11 0 - 35 U/L   Total Protein 7.5 6.0 - 8.3 g/dL   Albumin 4.5 3.5 - 5.2 g/dL   Calcium 9.8 8.4 - 10.5 mg/dL   GFR 91.77 >60.00 mL/min  Lipid panel  Result Value Ref Range   Cholesterol 210 (H) 0 - 200 mg/dL   Triglycerides 109.0 0.0 - 149.0 mg/dL   HDL 79.00 >39.00 mg/dL   VLDL 21.8 0.0 - 40.0 mg/dL   LDL Cholesterol 109 (H) 0 - 99 mg/dL   Total CHOL/HDL Ratio 3    NonHDL 130.62   TSH  Result Value Ref Range   TSH <0.01 (L) 0.35 - 4.50 uIU/mL  Hemoglobin A1c  Result Value Ref Range    Hgb A1c MFr Bld 5.3 4.6 - 6.5 %   Your labs are all normal except your TSH is low.  The TSH is a hormonal signal from our pituitary gland that signals the  thyroid glad;  If the TSH is low, it may mean that your thyroid is overactive.  This is certainly unexpected and you do not have any symptoms of hyperthyroidism (unexpected weight loss, tremors, anxiety) that I am aware of.  Because this result is so unexpected I would like to repeat your TSH before we take any action to make sure this is not an error.   Please come by the clinic for labs only in the next week or so and we will repeat this test for you.  Please let me know if you have any questions!

## 2016-10-04 ENCOUNTER — Encounter: Payer: Self-pay | Admitting: Family Medicine

## 2016-10-04 NOTE — Addendum Note (Signed)
Addended by: Lamar Blinks C on: 10/04/2016 09:06 PM   Modules accepted: Orders

## 2016-10-07 NOTE — Telephone Encounter (Signed)
Called pt and LMOM- went over results in my email in case she does not have mychart set up.  Asked her to come in for a lab visit this week.  Will send her a letter as well

## 2016-10-08 ENCOUNTER — Telehealth: Payer: Self-pay | Admitting: Family Medicine

## 2016-10-08 NOTE — Telephone Encounter (Signed)
°  Relation to YH:CWCB Call back [place]:  Reason for call:  Pt states Dr. Lorelei Pont called her on yesterday and she would like for her to call her back so she can speak with her directly, regarding lab results

## 2016-10-09 NOTE — Telephone Encounter (Signed)
Called pt- she has been taking a seaweed supplement which is high in iodine - she wonders if this has effected her thyroid.  She feels well- no sx of thyroid storm or hyperthyroidism- and would like to wait a couple of weeks to recheck her TSH.  She has stopped the seaweed.  This is fine- however if she develops any sx in the meantime we will recheck sooner

## 2016-10-19 ENCOUNTER — Other Ambulatory Visit (INDEPENDENT_AMBULATORY_CARE_PROVIDER_SITE_OTHER): Payer: Commercial Managed Care - HMO

## 2016-10-19 DIAGNOSIS — R946 Abnormal results of thyroid function studies: Secondary | ICD-10-CM | POA: Diagnosis not present

## 2016-10-19 DIAGNOSIS — R7989 Other specified abnormal findings of blood chemistry: Secondary | ICD-10-CM

## 2016-10-19 LAB — T3, FREE: T3 FREE: 5.7 pg/mL — AB (ref 2.3–4.2)

## 2016-10-19 LAB — TSH

## 2016-10-20 LAB — T4: T4 TOTAL: 13 ug/dL — AB (ref 4.5–12.0)

## 2016-10-22 ENCOUNTER — Encounter: Payer: Self-pay | Admitting: Family Medicine

## 2016-10-24 ENCOUNTER — Encounter: Payer: Self-pay | Admitting: Family Medicine

## 2016-10-24 ENCOUNTER — Other Ambulatory Visit: Payer: Self-pay | Admitting: Family Medicine

## 2016-10-24 DIAGNOSIS — R7989 Other specified abnormal findings of blood chemistry: Secondary | ICD-10-CM

## 2016-10-25 ENCOUNTER — Other Ambulatory Visit (INDEPENDENT_AMBULATORY_CARE_PROVIDER_SITE_OTHER): Payer: Commercial Managed Care - HMO

## 2016-10-25 DIAGNOSIS — R7989 Other specified abnormal findings of blood chemistry: Secondary | ICD-10-CM

## 2016-10-25 DIAGNOSIS — R946 Abnormal results of thyroid function studies: Secondary | ICD-10-CM | POA: Diagnosis not present

## 2016-10-28 LAB — THYROTROPIN RECEPTOR AUTOABS: THYROTROPIN RECEPTOR AB: 40.5 % — AB (ref <=16.0)

## 2016-10-30 ENCOUNTER — Other Ambulatory Visit: Payer: Self-pay | Admitting: Family Medicine

## 2016-10-30 ENCOUNTER — Encounter: Payer: Self-pay | Admitting: Family Medicine

## 2016-10-30 DIAGNOSIS — E0590 Thyrotoxicosis, unspecified without thyrotoxic crisis or storm: Secondary | ICD-10-CM

## 2016-10-30 MED ORDER — METHIMAZOLE 5 MG PO TABS: 5.0000 mg | ORAL_TABLET | Freq: Two times a day (BID) | ORAL | 1 refills | Status: DC

## 2016-10-30 NOTE — Progress Notes (Signed)
Received note from Dr. Cruzita Lederer  Thank you for the referral!  I reviewed her chart and she does appear to have Graves' disease. If you don't mind, let's start her on methimazole 5 mg twice a day with meals and I will recheck her tests when she comes back. I do not necessarily check a thyroid uptake and scan if the thyroid antibodies are elevated, especially in young patients, since diagnosis of Graves is the most probable.  Thank you,  Salena Saner

## 2016-11-13 ENCOUNTER — Encounter: Payer: Self-pay | Admitting: Family Medicine

## 2016-12-03 ENCOUNTER — Ambulatory Visit (INDEPENDENT_AMBULATORY_CARE_PROVIDER_SITE_OTHER): Payer: Commercial Managed Care - HMO | Admitting: Internal Medicine

## 2016-12-03 ENCOUNTER — Encounter: Payer: Self-pay | Admitting: Internal Medicine

## 2016-12-03 VITALS — BP 118/62 | HR 80 | Ht 68.5 in | Wt 153.0 lb

## 2016-12-03 DIAGNOSIS — E0590 Thyrotoxicosis, unspecified without thyrotoxic crisis or storm: Secondary | ICD-10-CM

## 2016-12-03 DIAGNOSIS — E0500 Thyrotoxicosis with diffuse goiter without thyrotoxic crisis or storm: Secondary | ICD-10-CM | POA: Insufficient documentation

## 2016-12-03 NOTE — Patient Instructions (Signed)
Please come back for labs in 2 weeks.  Continue Methimazole 5 mg 2x a day.  Please stop the Methimazole (Tapazole) and call us or your primary care doctor if you develop: - sore throat - fever - yellow skin - dark urine - light colored stools As we will then need to check your blood counts and liver tests.  Please return in 4 months.

## 2016-12-03 NOTE — Progress Notes (Addendum)
Patient ID: Whitney Bowman, female   DOB: 05-Sep-1970, 46 y.o.   MRN: [id number]    HPI  Whitney Bowman is a 46 y.o.-year-old female, referred by her PCP, Dr. Lorelei Pont for evaluation for thyrotoxicosis.  Pt's TFTs were found to be abnormal 1.5 mo ago - during an APE. Retrospectively, she had tachycardia for 10 years after the birth of her second child >> more frequent palpitations lately.   She started taking a cleanse supplement with iodine, kelp, seaweed (will scan list of ingredients) in mid-07/2016 >> taking it for 2 months when she had labs in 09/2016. She stopped this right after the labs.  She remembers having had a URI in 06/2017 >> bronchitis/sinusitis.  I reviewed pt's thyroid tests: Component     Latest Ref Rng & Units 10/03/2016 10/19/2016 10/25/2016  TSH     0.35 - 4.50 uIU/mL <0.01 (L) <0.01 (L)   Triiodothyronine,Free,Serum     2.3 - 4.2 pg/mL  5.7 (H)   Thyroxine (T4)     4.5 - 12.0 ug/dL  13.0 (H)   Thyrotropin Receptor Ab     <=16.0 %   40.5 (H)    Dr. Lorelei Pont started pt on MMI 5 mg bid after the labs were confirmed abnormal, due to high suspicion for Graves ds. I did discuss with PCP about the case at that time.  Pt denies feeling nodules in neck, hoarseness, dysphagia/odynophagia, SOB with lying down; she c/o: - + palpitations  - after 2nd pregnancy >> saw cardiology; had a Holter monitor >> lately more frequent - + anxiety - has 2 boys: 9 and 45 y/o - + insomnia - + fatigue - + lost weight 5 lbs over the winter - + excessive sweating/heat intolerance - + mild occasional tremors - no hyperdefecation, but she is more regular - no hair loss  She has no problems with exercise intolerance.  Pt does not have a FH of thyroid ds. No FH of thyroid cancer. No h/o radiation tx to head or neck. + FH of RA.  + seaweed or kelp >> stopped 1.5 mo ago, no recent contrast studies. No steroid use. No Biotin use. On MVI, Probiotic.  ROS: Constitutional: + see HPI Eyes: no  blurry vision, no xerophthalmia ENT: no sore throat, + see HPI Cardiovascular: no CP/SOB/+ palpitations/no leg swelling Respiratory: no cough/SOB Gastrointestinal: no N/V/D/C Musculoskeletal: no muscle/joint aches Skin: no rashes Neurological: + occas. tremors/no numbness/tingling/dizziness Psychiatric: no depression/+ mild anxiety  Past Medical History:  Diagnosis Date  . History of positive PPD   . Palpitations    Past Surgical History:  Procedure Laterality Date  . HERNIA REPAIR     Social History   Social History  . Marital status: Married    Spouse name: N/A  . 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 Smoking status: Never Smoker  . Smokeless tobacco: Never Used  . Alcohol use Yes     Comment: Wine, 3 times a week   . Drug use: No   Current Outpatient Prescriptions on File Prior to Visit  Medication Sig Dispense Refill  . cyclobenzaprine (FLEXERIL) 5 MG tablet Take 5 mg by mouth as needed (muscle spasms/cramps).     . methimazole (TAPAZOLE) 5 MG tablet Take 1 tablet (5 mg total) by mouth 2 (two) times daily. 60 tablet 1  . Multiple Vitamin (MULTIVITAMIN) tablet Take 1 tablet  by mouth daily.    . Probiotic Product (ALIGN PO) Take by mouth.     No current facility-administered medications on file prior to visit.    Allergies  Allergen Reactions  . Cefdinir     REACTION: PROTRACTED DIARRHEA/ COLITIS  . Shellfish Allergy Diarrhea and Nausea Only  . Sudafed [Pseudoephedrine Hcl]     Tachycardia  . Cephalosporins   . Codeine Nausea And Vomiting   Family History  Problem Relation Age of Onset  . Hypertension Mother   . Hypertension Father   Mother and father also had melanoma.  PE: BP 118/62 (BP Location: Left Arm, Patient Position: Sitting)   Pulse 80   Ht 5' 8.5" (1.74 m)   Wt 153 lb (69.4 kg)   LMP 11/09/2016   SpO2 97%   BMI 22.93 kg/m   Wt Readings from Last 3  Encounters:  12/03/16 153 lb (69.4 kg)  10/03/16 151 lb 6.4 oz (68.7 kg)  07/02/16 152 lb 6.4 oz (69.1 kg)   Constitutional: normal weight, in NAD Eyes: PERRLA, EOMI, no exophthalmos, no lid lag, no stare ENT: moist mucous membranes, no thyromegaly, no thyroid bruits, no cervical lymphadenopathy Cardiovascular: RRR, No MRG Respiratory: CTA B Gastrointestinal: abdomen soft, NT, ND, BS+ Musculoskeletal: no deformities, strength intact in all 4 Skin: moist, warm, no rashes Neurological: + Very faint tremor with outstretched hands, DTR normal in all 4  ASSESSMENT: 1. Thyrotoxicosis  PLAN:  1. Patient with a recently found low TSH x 1 + elevated T4 and T3, with mild thyrotoxic sxs: mild weight loss, some heat intolerance, + palpitations, + anxiety, + occas. Tremors. - She has 2 possible exogenous causes for her thyrotoxic episode: She took a supplement with increased iodine for 2 months before the abnormal tests were checked. She also remembers having an upper respiratory infection and then bronchitis 3 months prior to the abnormal labs. - We discussed that possible endogenous causes of thyrotoxicosis are:  Graves ds  (and we discussed that this could have been triggered by her increased iodine intake from the supplement). Of note, a thyroid receptor antibody level checked by PCP was elevated. Thyroiditis (possible following her URI) toxic multinodular goiter/ toxic adenoma (I cannot feel nodules at palpation of her thyroid). - I suggested that we check the TSH, fT3 and fT4 and also add thyroid stimulating antibodies to further screen for Graves' disease.  - After the results are back, we may need an uptake and scan to differentiate between the 3 above possible etiologies, however, my suspicion would be higher for Graves' disease so we may be able to circumvent this test - we discussed about possible modalities of treatment for the above conditions, to include methimazole use, radioactive  iodine ablation or (last resort) surgery. She is doing well on methimazole, but she only started this less than 3 weeks ago. I advised her to continue for now I will have her back for the above labs in 2 more weeks. - We did discussed about adding a beta blockers since she mentions that she is occasionally tachycardic and may have mild tremors, but she refuses for now. I advised her to check pulse at home, and if higher than 90 at rest, to let me know because in that case, I would strongly suggest to start this - RTC in 4 months, but likely sooner for repeat labs  Component     Latest Ref Rng & Units 12/21/2016  TSH     mIU/L <0.01 (L)  Triiodothyronine,Free,Serum     2.3 - 4.2 pg/mL 4.1  TSI     <140 % baseline 241 (H)  T4,Free(Direct)     0.8 - 1.8 ng/dL 1.4   TSI (and TRAb) high, confirming Graves ds. Patient is free thyroid hormones improved on her current dose of methimazole, a 5 mg twice a day. The TSH is still low, however, this lags behind the rest of the tests. For now, I would suggest to continue the current dose of methimazole and repeat labs in 1-1/2 months.  Philemon Kingdom, MD PhD [facility] Endocrinology

## 2016-12-21 ENCOUNTER — Other Ambulatory Visit (INDEPENDENT_AMBULATORY_CARE_PROVIDER_SITE_OTHER): Payer: Commercial Managed Care - HMO

## 2016-12-21 DIAGNOSIS — E0590 Thyrotoxicosis, unspecified without thyrotoxic crisis or storm: Secondary | ICD-10-CM

## 2016-12-21 LAB — T3, FREE: T3, Free: 4.1 pg/mL (ref 2.3–4.2)

## 2016-12-21 LAB — TSH: TSH: <0.01 mIU/L — ABNORMAL LOW

## 2016-12-21 LAB — T4, FREE: Free T4: 1.4 ng/dL (ref 0.8–1.8)

## 2016-12-27 ENCOUNTER — Encounter: Payer: Self-pay | Admitting: Internal Medicine

## 2016-12-27 LAB — THYROID STIMULATING IMMUNOGLOBULIN: TSI: 241 %{baseline} — AB (ref <140)

## 2016-12-27 NOTE — Addendum Note (Signed)
Addended by: Philemon Kingdom on: 12/27/2016 04:11 PM   Modules accepted: Orders

## 2017-01-09 ENCOUNTER — Other Ambulatory Visit: Payer: Self-pay

## 2017-01-09 ENCOUNTER — Encounter: Payer: Self-pay | Admitting: Internal Medicine

## 2017-01-09 DIAGNOSIS — E0590 Thyrotoxicosis, unspecified without thyrotoxic crisis or storm: Secondary | ICD-10-CM

## 2017-01-09 MED ORDER — METHIMAZOLE 5 MG PO TABS: 5.0000 mg | ORAL_TABLET | Freq: Two times a day (BID) | ORAL | 1 refills | Status: DC

## 2017-02-15 ENCOUNTER — Other Ambulatory Visit (INDEPENDENT_AMBULATORY_CARE_PROVIDER_SITE_OTHER): Payer: 59

## 2017-02-15 DIAGNOSIS — E0590 Thyrotoxicosis, unspecified without thyrotoxic crisis or storm: Secondary | ICD-10-CM | POA: Diagnosis not present

## 2017-02-15 LAB — TSH: TSH: 0.33 u[IU]/mL — ABNORMAL LOW (ref 0.35–4.50)

## 2017-02-15 LAB — T3, FREE: T3, Free: 2.9 pg/mL (ref 2.3–4.2)

## 2017-02-15 LAB — T4, FREE: Free T4: 0.7 ng/dL (ref 0.60–1.60)

## 2017-03-11 ENCOUNTER — Other Ambulatory Visit: Payer: Self-pay | Admitting: Obstetrics and Gynecology

## 2017-03-11 DIAGNOSIS — Z1231 Encounter for screening mammogram for malignant neoplasm of breast: Secondary | ICD-10-CM

## 2017-03-13 DIAGNOSIS — H524 Presbyopia: Secondary | ICD-10-CM | POA: Diagnosis not present

## 2017-03-13 DIAGNOSIS — H5213 Myopia, bilateral: Secondary | ICD-10-CM | POA: Diagnosis not present

## 2017-03-21 ENCOUNTER — Encounter: Payer: Self-pay | Admitting: Internal Medicine

## 2017-03-22 ENCOUNTER — Other Ambulatory Visit: Payer: Self-pay

## 2017-03-22 DIAGNOSIS — E0590 Thyrotoxicosis, unspecified without thyrotoxic crisis or storm: Secondary | ICD-10-CM

## 2017-03-22 MED ORDER — METHIMAZOLE 5 MG PO TABS: 5.0000 mg | ORAL_TABLET | Freq: Two times a day (BID) | ORAL | 1 refills | Status: DC

## 2017-04-08 ENCOUNTER — Other Ambulatory Visit (INDEPENDENT_AMBULATORY_CARE_PROVIDER_SITE_OTHER): Payer: 59

## 2017-04-08 ENCOUNTER — Ambulatory Visit (INDEPENDENT_AMBULATORY_CARE_PROVIDER_SITE_OTHER): Payer: 59 | Admitting: Internal Medicine

## 2017-04-08 VITALS — BP 120/70 | HR 72 | Wt 163.0 lb

## 2017-04-08 DIAGNOSIS — Z23 Encounter for immunization: Secondary | ICD-10-CM | POA: Diagnosis not present

## 2017-04-08 DIAGNOSIS — E0500 Thyrotoxicosis with diffuse goiter without thyrotoxic crisis or storm: Secondary | ICD-10-CM | POA: Diagnosis not present

## 2017-04-08 DIAGNOSIS — E0590 Thyrotoxicosis, unspecified without thyrotoxic crisis or storm: Secondary | ICD-10-CM | POA: Diagnosis not present

## 2017-04-08 LAB — T4, FREE: Free T4: 0.64 ng/dL (ref 0.60–1.60)

## 2017-04-08 LAB — T3, FREE: T3 FREE: 3.1 pg/mL (ref 2.3–4.2)

## 2017-04-08 LAB — TSH: TSH: 3.26 u[IU]/mL (ref 0.35–4.50)

## 2017-04-08 NOTE — Patient Instructions (Addendum)
Please continue methimazole 5 mg twice a day.  Please stop at the lab.  Please come back for a follow-up appointment in 6 months.

## 2017-04-08 NOTE — Progress Notes (Signed)
Patient ID: Whitney Bowman, female   DOB: [birth year], 46 y.o.   MRN: [id number]    HPI  Whitney Bowman is a 46 y.o.-year-old female, initially referred by her PCP, Dr. 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 Last visit 4 months ago.  Reviewed and addended history: Pt's TFTs were found to be abnormal this Spring- during an APE. Retrospectively, she had tachycardia for 10 years after the birth of her second child >> more frequent palpitations lately.   She started taking a cleanse supplement with iodine, kelp, seaweed (list of ingredients - scanned) in mid-07/2016 >> taking it for 2 months when she had labs in 09/2016. She stopped this right after the labs.  She remembers having had a URI in 06/2017 >> bronchitis/sinusitis.  I discussed with Dr. Lorelei Pont about the patient and we decided to start her on MMI 5 mg bid after the labs were confirmed abnormal, due to high suspicion for Graves ds. At last visit, her TFTs were improved so we continued the methimazole at the same dose.  Lab Results  Component Value Date   TSH 0.33 (L) 02/15/2017   TSH <0.01 (L) 12/21/2016   TSH <0.01 (L) 10/19/2016   TSH <0.01 (L) 10/03/2016   FREET4 0.70 02/15/2017   FREET4 1.4 12/21/2016   T3FREE 2.9 02/15/2017   T3FREE 4.1 12/21/2016   T3FREE 5.7 (H) 10/19/2016   Her Graves antibodies were elevated:  Lab Results  Component Value Date   TSI 241 (H) 12/21/2016   Also, her thyrotropin receptor antibodies were elevated: Component     Latest Ref Rng & Units 10/25/2016  Thyrotropin Receptor Ab     <=16.0 % 40.5 (H)    Pt denies: - feeling nodules in neck - hoarseness - dysphagia - choking - SOB with lying down  At last visit, she was complaining of palpitations (after 2nd pregnancy >> saw cardiology; had a Holter monitor), Insomnia, fatigue, 5 pound weight loss, anxiety (has 2 boys),  excessive sweating/heat intolerance, mild occasional tremors.  she  now complains of weight gain (10 pounds since last visit). All her other sxs are resolved.  Pt does not have a FH of thyroid ds. No FH of thyroid cancer. No h/o radiation tx to head or neck.  No seaweed or kelp. No recent contrast studies. No herbal supplements. No Biotin use. No recent steroids use.   + seaweed or kelp >> stopped 1.5 mo before last visit. She has family history of RA.  ROS: Constitutional: + see HPI Eyes: no blurry vision, no xerophthalmia ENT: no sore throat, + see HPI Cardiovascular: no CP/no SOB/no palpitations/no leg swelling Respiratory: no cough/no SOB/no wheezing Gastrointestinal: no N/no V/no D/no C/no acid reflux Musculoskeletal: no muscle aches/no joint aches Skin: no rashes, no hair loss Neurological: no tremors/no numbness/no tingling/no dizziness  I reviewed pt's medications, allergies, PMH, social hx, family hx, and changes were documented in the history of present illness. Otherwise, unchanged from my initial visit note.  Past Medical History:  Diagnosis Date  . History of positive PPD   . Palpitations    Past Surgical History:  Procedure Laterality Date  . HERNIA REPAIR     Social History   Social History  . Marital status: Married    Spouse name: N/A  . 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 Smoking status:  Never Smoker  . Smokeless tobacco: Never Used  . Alcohol use Yes     Comment: Wine, 3 times a week   . Drug use: No   Current Outpatient Prescriptions on File Prior to Visit  Medication Sig Dispense Refill  . cyclobenzaprine (FLEXERIL) 5 MG tablet Take 5 mg by mouth as needed (muscle spasms/cramps).     . methimazole (TAPAZOLE) 5 MG tablet Take 1 tablet (5 mg total) by mouth 2 (two) times daily. 60 tablet 1  . Multiple Vitamin (MULTIVITAMIN) tablet Take 1 tablet by mouth daily.    . Probiotic Product (ALIGN PO) Take by mouth.     No current  facility-administered medications on file prior to visit.    Allergies  Allergen Reactions  . Cefdinir     REACTION: PROTRACTED DIARRHEA/ COLITIS  . Shellfish Allergy Diarrhea and Nausea Only  . Sudafed [Pseudoephedrine Hcl]     Tachycardia  . Cephalosporins   . Codeine Nausea And Vomiting   Family History  Problem Relation Age of Onset  . Hypertension Mother   . Hypertension Father   Mother and father also had melanoma.  PE: BP 120/70 (BP Location: Left Arm, Patient Position: Sitting)   Pulse 72   Wt 163 lb (73.9 kg)   LMP 03/23/2017   SpO2 98%   BMI 24.42 kg/m  Wt Readings from Last 3 Encounters:  04/08/17 163 lb (73.9 kg)  12/03/16 153 lb (69.4 kg)  10/03/16 151 lb 6.4 oz (68.7 kg)   Constitutional: normal weight, in NAD Eyes: PERRLA, EOMI, no exophthalmos ENT: moist mucous membranes, no thyromegaly, no cervical lymphadenopathy Cardiovascular: RRR, No MRG Respiratory: CTA B Gastrointestinal: abdomen soft, NT, ND, BS+ Musculoskeletal: no deformities, strength intact in all 4 Skin: moist, warm, no rashes Neurological: no tremor with outstretched hands, DTR normal in all 4  ASSESSMENT: 1.Graves' disease  PLAN:  1. Patient with Thyrotoxicosis diagnosed this spring, with mild thyrotoxic symptoms: Mild weight loss, some heat intolerance, palpitations, anxiety, and tremors, diagnosed with Graves' disease at last visit after TSI antibodies and thyrotropin receptor antibodies returned positive. Around that time, she was also taking iodine supplements and also had an upper respiratory infection and bronchitis.  - We started her on methimazole, to which she responded very well, with improved TFTs at last check 2 months ago. - Since last visit, she describes that her thyrotoxic symptoms have resolved and she even gained 10 pounds. - At this visit, we'll check her TSH, free T4, free T3 and I'm hoping that we can decrease her methimazole dose. - I do not feel she needs a beta  blocker since her heartrate is normal-  recently found low TSH x 1 + elevated T4 and T3, with mild thyrotoxic sxs: mild weight loss, some heat intolerance, + palpitations, + anxiety, + occas. Tremors. - RTC in 6 mo, but likely sooner for labs  Needs refills.  Lab on 04/08/2017  Component Date Value Ref Range Status  . Free T4 04/08/2017 0.64  0.60 - 1.60 ng/dL Final   Comment: Specimens from patients who are undergoing biotin therapy and /or ingesting biotin supplements may contain high levels of biotin.  The higher biotin concentration in these specimens interferes with this Free T4 assay.  Specimens that contain high levels  of biotin may cause false high results for this Free T4 assay.  Please interpret results in light of the total clinical presentation of the patient.    Marland Kitchen TSH 04/08/2017 3.26  0.35 - 4.50  uIU/mL Final  . T3, Free 04/08/2017 3.1  2.3 - 4.2 pg/mL Final   TFTs normal >> will decrease MMI from 5 mg bid to qd. Will repeat TFTs in 1.5 mo.  Philemon Kingdom, MD PhD [facility]

## 2017-04-09 ENCOUNTER — Encounter: Payer: Self-pay | Admitting: Internal Medicine

## 2017-04-09 MED ORDER — METHIMAZOLE 5 MG PO TABS: 5.0000 mg | ORAL_TABLET | Freq: Every day | ORAL | 1 refills | Status: DC

## 2017-04-17 ENCOUNTER — Ambulatory Visit
Admission: RE | Admit: 2017-04-17 | Discharge: 2017-04-17 | Disposition: A | Discharge status: Home / Self Care | Payer: 59 | Source: Ambulatory Visit | Attending: Obstetrics and Gynecology | Admitting: Obstetrics and Gynecology

## 2017-04-17 DIAGNOSIS — Z1231 Encounter for screening mammogram for malignant neoplasm of breast: Secondary | ICD-10-CM

## 2017-05-23 ENCOUNTER — Encounter: Payer: Self-pay | Admitting: Internal Medicine

## 2017-05-23 ENCOUNTER — Other Ambulatory Visit (INDEPENDENT_AMBULATORY_CARE_PROVIDER_SITE_OTHER): Payer: 59

## 2017-05-23 DIAGNOSIS — E0500 Thyrotoxicosis with diffuse goiter without thyrotoxic crisis or storm: Secondary | ICD-10-CM | POA: Diagnosis not present

## 2017-05-23 DIAGNOSIS — E0590 Thyrotoxicosis, unspecified without thyrotoxic crisis or storm: Secondary | ICD-10-CM

## 2017-05-23 LAB — T4, FREE: FREE T4: 0.73 ng/dL (ref 0.60–1.60)

## 2017-05-23 LAB — T3, FREE: T3, Free: 3.1 pg/mL (ref 2.3–4.2)

## 2017-05-23 LAB — TSH: TSH: 4.47 u[IU]/mL (ref 0.35–4.50)

## 2017-05-23 MED ORDER — METHIMAZOLE 5 MG PO TABS: 2.5000 mg | ORAL_TABLET | Freq: Every day | ORAL | 1 refills | Status: DC

## 2017-06-18 DIAGNOSIS — M7711 Lateral epicondylitis, right elbow: Secondary | ICD-10-CM | POA: Insufficient documentation

## 2017-06-26 DIAGNOSIS — M25631 Stiffness of right wrist, not elsewhere classified: Secondary | ICD-10-CM | POA: Diagnosis not present

## 2017-06-26 DIAGNOSIS — R29898 Other symptoms and signs involving the musculoskeletal system: Secondary | ICD-10-CM | POA: Diagnosis not present

## 2017-06-26 DIAGNOSIS — M25521 Pain in right elbow: Secondary | ICD-10-CM | POA: Diagnosis not present

## 2017-06-26 DIAGNOSIS — M7711 Lateral epicondylitis, right elbow: Secondary | ICD-10-CM | POA: Diagnosis not present

## 2017-06-28 DIAGNOSIS — R29898 Other symptoms and signs involving the musculoskeletal system: Secondary | ICD-10-CM | POA: Diagnosis not present

## 2017-06-28 DIAGNOSIS — M25631 Stiffness of right wrist, not elsewhere classified: Secondary | ICD-10-CM | POA: Diagnosis not present

## 2017-06-28 DIAGNOSIS — M25521 Pain in right elbow: Secondary | ICD-10-CM | POA: Diagnosis not present

## 2017-06-28 DIAGNOSIS — M7711 Lateral epicondylitis, right elbow: Secondary | ICD-10-CM | POA: Diagnosis not present

## 2017-07-05 DIAGNOSIS — M25521 Pain in right elbow: Secondary | ICD-10-CM | POA: Diagnosis not present

## 2017-07-05 DIAGNOSIS — R29898 Other symptoms and signs involving the musculoskeletal system: Secondary | ICD-10-CM | POA: Diagnosis not present

## 2017-07-05 DIAGNOSIS — M7711 Lateral epicondylitis, right elbow: Secondary | ICD-10-CM | POA: Diagnosis not present

## 2017-07-05 DIAGNOSIS — M25631 Stiffness of right wrist, not elsewhere classified: Secondary | ICD-10-CM | POA: Diagnosis not present

## 2017-07-11 ENCOUNTER — Other Ambulatory Visit (INDEPENDENT_AMBULATORY_CARE_PROVIDER_SITE_OTHER): Payer: 59

## 2017-07-11 DIAGNOSIS — E0500 Thyrotoxicosis with diffuse goiter without thyrotoxic crisis or storm: Secondary | ICD-10-CM

## 2017-07-11 LAB — T4, FREE: Free T4: 0.76 ng/dL (ref 0.60–1.60)

## 2017-07-11 LAB — TSH: TSH: 2.38 u[IU]/mL (ref 0.35–4.50)

## 2017-07-11 LAB — T3, FREE: T3, Free: 3.6 pg/mL (ref 2.3–4.2)

## 2017-07-12 ENCOUNTER — Encounter: Payer: Self-pay | Admitting: Internal Medicine

## 2017-07-12 DIAGNOSIS — M7711 Lateral epicondylitis, right elbow: Secondary | ICD-10-CM | POA: Diagnosis not present

## 2017-07-12 DIAGNOSIS — R29898 Other symptoms and signs involving the musculoskeletal system: Secondary | ICD-10-CM | POA: Diagnosis not present

## 2017-07-12 DIAGNOSIS — M25521 Pain in right elbow: Secondary | ICD-10-CM | POA: Diagnosis not present

## 2017-07-17 DIAGNOSIS — M7711 Lateral epicondylitis, right elbow: Secondary | ICD-10-CM | POA: Diagnosis not present

## 2017-07-17 DIAGNOSIS — M25631 Stiffness of right wrist, not elsewhere classified: Secondary | ICD-10-CM | POA: Diagnosis not present

## 2017-07-17 DIAGNOSIS — R29898 Other symptoms and signs involving the musculoskeletal system: Secondary | ICD-10-CM | POA: Diagnosis not present

## 2017-07-17 DIAGNOSIS — M25521 Pain in right elbow: Secondary | ICD-10-CM | POA: Diagnosis not present

## 2017-08-01 DIAGNOSIS — M7711 Lateral epicondylitis, right elbow: Secondary | ICD-10-CM | POA: Diagnosis not present

## 2017-08-02 DIAGNOSIS — M7711 Lateral epicondylitis, right elbow: Secondary | ICD-10-CM | POA: Diagnosis not present

## 2017-08-02 DIAGNOSIS — M25631 Stiffness of right wrist, not elsewhere classified: Secondary | ICD-10-CM | POA: Diagnosis not present

## 2017-08-02 DIAGNOSIS — M25521 Pain in right elbow: Secondary | ICD-10-CM | POA: Diagnosis not present

## 2017-08-02 DIAGNOSIS — R29898 Other symptoms and signs involving the musculoskeletal system: Secondary | ICD-10-CM | POA: Diagnosis not present

## 2017-08-09 DIAGNOSIS — M25521 Pain in right elbow: Secondary | ICD-10-CM | POA: Diagnosis not present

## 2017-08-09 DIAGNOSIS — M25631 Stiffness of right wrist, not elsewhere classified: Secondary | ICD-10-CM | POA: Diagnosis not present

## 2017-08-09 DIAGNOSIS — M7711 Lateral epicondylitis, right elbow: Secondary | ICD-10-CM | POA: Diagnosis not present

## 2017-08-09 DIAGNOSIS — R29898 Other symptoms and signs involving the musculoskeletal system: Secondary | ICD-10-CM | POA: Diagnosis not present

## 2017-08-14 DIAGNOSIS — M25631 Stiffness of right wrist, not elsewhere classified: Secondary | ICD-10-CM | POA: Diagnosis not present

## 2017-08-14 DIAGNOSIS — M25521 Pain in right elbow: Secondary | ICD-10-CM | POA: Diagnosis not present

## 2017-08-14 DIAGNOSIS — R29898 Other symptoms and signs involving the musculoskeletal system: Secondary | ICD-10-CM | POA: Diagnosis not present

## 2017-08-14 DIAGNOSIS — M7711 Lateral epicondylitis, right elbow: Secondary | ICD-10-CM | POA: Diagnosis not present

## 2017-08-19 ENCOUNTER — Encounter: Payer: Self-pay | Admitting: Family Medicine

## 2017-08-19 ENCOUNTER — Ambulatory Visit: Payer: 59 | Admitting: Family Medicine

## 2017-08-19 ENCOUNTER — Ambulatory Visit (HOSPITAL_BASED_OUTPATIENT_CLINIC_OR_DEPARTMENT_OTHER)
Admission: RE | Admit: 2017-08-19 | Discharge: 2017-08-19 | Disposition: A | Discharge status: Home / Self Care | Payer: 59 | Source: Ambulatory Visit | Attending: Family Medicine | Admitting: Family Medicine

## 2017-08-19 VITALS — BP 110/72 | HR 104 | Temp 99.4°F | Ht 68.0 in | Wt 164.0 lb

## 2017-08-19 DIAGNOSIS — R05 Cough: Secondary | ICD-10-CM | POA: Insufficient documentation

## 2017-08-19 DIAGNOSIS — R52 Pain, unspecified: Secondary | ICD-10-CM | POA: Diagnosis not present

## 2017-08-19 DIAGNOSIS — J111 Influenza due to unidentified influenza virus with other respiratory manifestations: Secondary | ICD-10-CM | POA: Diagnosis not present

## 2017-08-19 DIAGNOSIS — R059 Cough, unspecified: Secondary | ICD-10-CM

## 2017-08-19 LAB — POCT INFLUENZA A/B
INFLUENZA B, POC: NEGATIVE
Influenza A, POC: NEGATIVE

## 2017-08-19 MED ORDER — OSELTAMIVIR PHOSPHATE 75 MG PO CAPS: 75.0000 mg | ORAL_CAPSULE | Freq: Two times a day (BID) | ORAL | 0 refills | Status: DC

## 2017-08-19 NOTE — Patient Instructions (Addendum)
You likely have the flu - use the tamiflu twice a day for 5 days Continue ibuprofen or aleve, and tylenol as needed Rest, plenty of fluids Please alert me if you are getting worse or not better in the next couple of days

## 2017-08-19 NOTE — Progress Notes (Signed)
[place] at [facility] [address] [place], [place] [address] [phone]  Date:  08/19/2017   Name:  Whitney Bowman   DOB:  12-12-1970   MRN:  [id number]  PCP:  Darreld Mclean, MD    Chief Complaint: No chief complaint on file.   History of Present Illness:  Whitney Bowman is a 47 y.o. very pleasant female patient who presents with the following:  Here today with flu like illness.  Today is Monday- on Thursday she noted onset of cough.  Got more sick over the weekend, fever up to 102.6 yesterday She has been using some advil OTC Cough, sinus headache No GI symptoms ST from the cough she thinks  No flu contacts at home   Patient Active Problem List   Diagnosis Date Noted  . Graves disease 12/03/2016  . Acute bronchitis 06/08/2014  . Chest pain 11/13/2013  . Palpitations 11/11/2013  . Umbilical pain 14/48/1856  . CAP (community acquired pneumonia) 08/22/2012  . 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 History of positive PPD   . Palpitations     Past Surgical History:  Procedure Laterality Date  . HERNIA REPAIR      Social History   Tobacco Use  . Smoking status: Never Smoker  . Smokeless tobacco: Never Used  Substance Use Topics  . Alcohol use: Yes    Comment: Social   . Drug use: No    Family History  Problem Relation Age of Onset  . Hypertension Mother   . Hypertension Father     Allergies  Allergen Reactions  . Cefdinir     REACTION: PROTRACTED DIARRHEA/ COLITIS  . Shellfish Allergy Diarrhea and Nausea Only  . Sudafed [Pseudoephedrine Hcl]     Tachycardia  . Cephalosporins   . Codeine Nausea And Vomiting    Medication list has been reviewed and updated.  Current Outpatient Medications on File Prior to Visit  Medication Sig Dispense Refill  . cyclobenzaprine (FLEXERIL) 5 MG tablet Take 5 mg by mouth as needed (muscle  spasms/cramps).     . methimazole (TAPAZOLE) 5 MG tablet Take 0.5 tablets (2.5 mg total) by mouth daily. 60 tablet 1  . Multiple Vitamin (MULTIVITAMIN) tablet Take 1 tablet by mouth daily.    . Probiotic Product (ALIGN PO) Take by mouth.     No current facility-administered medications on file prior to visit.     Review of Systems:  As per HPI- otherwise negative.   Physical Examination: Vitals:   08/19/17 1117  BP: 110/72  Pulse: (!) 104  Temp: 99.4 F (37.4 C)  SpO2: 98%   Vitals:   08/19/17 1117  Weight: 164 lb (74.4 kg)  Height: 5' 8"  (1.727 m)   Body mass index is 24.94 kg/m. Ideal Body Weight: Weight in (lb) to have BMI = 25: 164.1  GEN: WDWN, NAD, Non-toxic, A & O x 3, looks well but is warm due to fever HEENT: Atraumatic, Normocephalic. Neck supple. No masses, No LAD.  Bilateral TM wnl, oropharynx normal.  PEERL,EOMI.   Ears and Nose: No external deformity. CV: RRR, No M/G/R. No JVD. No thrill. No extra heart sounds. PULM: CTA B, no wheezes, crackles, rhonchi. No retractions. No resp. distress. No accessory muscle use. ABD: S, NT, ND  Benign belly  EXTR: No c/c/e NEURO Normal gait.  PSYCH: Normally interactive. Conversant. Not  depressed or anxious appearing.  Calm demeanor.   Dg Chest 2 View  Result Date: 08/19/2017 CLINICAL DATA:  Cough and fever for 1 week. EXAM: CHEST  2 VIEW COMPARISON:  08/05/2009 FINDINGS: The heart size and mediastinal contours are within normal limits. Both lungs are clear. The visualized skeletal structures are unremarkable. IMPRESSION: No active cardiopulmonary disease. Electronically Signed   By: Earle Gell M.D.   On: 08/19/2017 11:56   Results for orders placed or performed in visit on 08/19/17  POCT Influenza A/B  Result Value Ref Range   Influenza A, POC Negative Negative   Influenza B, POC Negative Negative     Assessment and Plan: Influenza - Plan: oseltamivir (TAMIFLU) 75 MG capsule  Body aches - Plan: POCT Influenza  A/B, DG Chest 2 View, CANCELED: Flu Vaccine QUAD 36+ mos IM (Fluarix & Fluzone Quad PF  Cough - Plan: DG Chest 2 View  Here today with flu like sx Rapid flu is negative but CXR negative as well- suspect flu Will treat with tamiflu for 5 days Supportive care She will let me know if not feeling better- Sooner if worse.  Meds ordered this encounter  Medications  . oseltamivir (TAMIFLU) 75 MG capsule    Sig: Take 1 capsule (75 mg total) by mouth 2 (two) times daily.    Dispense:  10 capsule    Refill:  0     Signed Lamar Blinks, MD

## 2017-08-23 DIAGNOSIS — M7711 Lateral epicondylitis, right elbow: Secondary | ICD-10-CM | POA: Diagnosis not present

## 2017-08-23 DIAGNOSIS — R29898 Other symptoms and signs involving the musculoskeletal system: Secondary | ICD-10-CM | POA: Diagnosis not present

## 2017-08-23 DIAGNOSIS — M25521 Pain in right elbow: Secondary | ICD-10-CM | POA: Diagnosis not present

## 2017-08-24 ENCOUNTER — Encounter: Payer: Self-pay | Admitting: Internal Medicine

## 2017-08-26 ENCOUNTER — Other Ambulatory Visit: Payer: Self-pay | Admitting: Internal Medicine

## 2017-08-26 DIAGNOSIS — E0590 Thyrotoxicosis, unspecified without thyrotoxic crisis or storm: Secondary | ICD-10-CM

## 2017-08-26 MED ORDER — METHIMAZOLE 5 MG PO TABS: 2.5000 mg | ORAL_TABLET | Freq: Every day | ORAL | 5 refills | Status: DC

## 2017-09-02 DIAGNOSIS — M7711 Lateral epicondylitis, right elbow: Secondary | ICD-10-CM | POA: Diagnosis not present

## 2017-10-08 ENCOUNTER — Ambulatory Visit: Payer: 59 | Admitting: Internal Medicine

## 2017-10-08 ENCOUNTER — Encounter: Payer: Self-pay | Admitting: Internal Medicine

## 2017-10-08 VITALS — BP 124/72 | HR 79 | Ht 68.0 in | Wt 166.8 lb

## 2017-10-08 DIAGNOSIS — E0500 Thyrotoxicosis with diffuse goiter without thyrotoxic crisis or storm: Secondary | ICD-10-CM

## 2017-10-08 LAB — TSH: TSH: 3.31 u[IU]/mL (ref 0.35–4.50)

## 2017-10-08 LAB — T4, FREE: Free T4: 0.74 ng/dL (ref 0.60–1.60)

## 2017-10-08 LAB — T3, FREE: T3 FREE: 3.3 pg/mL (ref 2.3–4.2)

## 2017-10-08 NOTE — Progress Notes (Signed)
Patient ID: Whitney Bowman, female   DOB: 1970-11-06, 47 y.o.   MRN: [id number]    HPI  Whitney Bowman is a 47 y.o.-year-old female, initially referred by her PCP, Dr. Lorelei Pont for evaluation for Graves' disease. Last visit 6 months ago.  Reviewed and addended history: Pt's TFTs were found to be abnormal this Spring- during an APE. Retrospectively, she had tachycardia for 10 years after the birth of her second child >> palpitations.  She started taking a cleanse supplement with iodine, kelp, seaweed (list of ingredients - scanned) in mid-07/2016 >> taking it for 2 months when she had labs in 09/2016.  She stopped these right after the labs returned.  She remembers having had a URI  in 06/2017: Bronchitis/sinusitis.  Patient was started on methimazole 5 mg twice daily initially.  At last visit, as her tests were normal, we decreased the dose to 5 mg daily.  In 06/2017, as the tests continue to remain normal, we decrease the methimazole dose to 2.5 mg daily.   Lab Results  Component Value Date   TSH 2.38 07/11/2017   TSH 4.47 05/23/2017   TSH 3.26 04/08/2017   TSH 0.33 (L) 02/15/2017   TSH <0.01 (L) 12/21/2016   TSH <0.01 (L) 10/19/2016   TSH <0.01 (L) 10/03/2016   FREET4 0.76 07/11/2017   FREET4 0.73 05/23/2017   FREET4 0.64 04/08/2017   FREET4 0.70 02/15/2017   FREET4 1.4 12/21/2016   T3FREE 3.6 07/11/2017   T3FREE 3.1 05/23/2017   T3FREE 3.1 04/08/2017   T3FREE 2.9 02/15/2017   T3FREE 4.1 12/21/2016   T3FREE 5.7 (H) 10/19/2016   Her Graves' antibodies were elevated: Lab Results  Component Value Date   TSI 241 (H) 12/21/2016   Also, her thyrotropin receptor antibodies were elevated: Component     Latest Ref Rng & Units 10/25/2016  Thyrotropin Receptor Ab     <=16.0 % 40.5 (H)    Pt denies: - feeling nodules in neck - hoarseness - dysphagia - choking - SOB with lying down  She initially had palpitations (after her second pregnancy: Saw cardiology; had a Holter  monitor), insomnia, fatigue, initially weight loss but she has gained weight afterwards, anxiety, excessive sweating and heat intolerance, mild occasional tremors.  The symptoms are resolved except for weight gain.  Pt does not have a FH of thyroid ds. No FH of thyroid cancer. No h/o radiation tx to head or neck.  No seaweed or kelp. No recent contrast studies. No herbal supplements. No Biotin use. No recent steroids use. She has family history of RA.  Had tennis elbow since last visit 2/2 sleeping position. Improved now.  ROS: Constitutional: + weight gain/no weight loss, no fatigue, no subjective hyperthermia, no subjective hypothermia Eyes: no blurry vision, no xerophthalmia ENT: no sore throat, + see HPI Cardiovascular: no CP/no SOB/+ occas. palpitations/no leg swelling Respiratory: no cough/no SOB/no wheezing Gastrointestinal: no N/no V/no D/no C/no acid reflux Musculoskeletal: no muscle aches/no joint aches Skin: no rashes, no hair loss Neurological: no tremors/no numbness/no tingling/no dizziness  I reviewed pt's medications, allergies, PMH, social hx, family hx, and changes were documented in the history of present illness. Otherwise, unchanged from my initial visit note.   Past Medical History:  Diagnosis Date  . History of positive PPD   . Palpitations    Past Surgical History:  Procedure Laterality Date  . HERNIA REPAIR     Social History   Social History  . Marital status: Married  Spouse name: N/A  . 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 Smoking status: Never Smoker  . Smokeless tobacco: Never Used  . Alcohol use Yes     Comment: Wine, 3 times a week   . Drug use: No   Current Outpatient Medications on File Prior to Visit  Medication Sig Dispense Refill  . cyclobenzaprine (FLEXERIL) 5 MG tablet Take 5 mg by mouth as needed (muscle spasms/cramps).     . methimazole  (TAPAZOLE) 5 MG tablet Take 0.5 tablets (2.5 mg total) by mouth daily. 30 tablet 5  . Multiple Vitamin (MULTIVITAMIN) tablet Take 1 tablet by mouth daily.    Marland Kitchen oseltamivir (TAMIFLU) 75 MG capsule Take 1 capsule (75 mg total) by mouth 2 (two) times daily. 10 capsule 0  . Probiotic Product (ALIGN PO) Take by mouth.     No current facility-administered medications on file prior to visit.    Allergies  Allergen Reactions  . Cefdinir     REACTION: PROTRACTED DIARRHEA/ COLITIS  . Shellfish Allergy Diarrhea and Nausea Only  . Sudafed [Pseudoephedrine Hcl]     Tachycardia  . Cephalosporins   . Codeine Nausea And Vomiting   Family History  Problem Relation Age of Onset  . Hypertension Mother   . Hypertension Father   Mother and father also had melanoma.  PE: BP 124/72 (BP Location: Left Arm, Patient Position: Sitting, Cuff Size: Normal)   Pulse 79   Ht 5' 8"  (1.727 m)   Wt 166 lb 12.8 oz (75.7 kg)   SpO2 98%   BMI 25.36 kg/m  Wt Readings from Last 3 Encounters:  10/08/17 166 lb 12.8 oz (75.7 kg)  08/19/17 164 lb (74.4 kg)  04/08/17 163 lb (73.9 kg)   Constitutional: normal weight, in NAD Eyes: PERRLA, EOMI, no exophthalmos ENT: moist mucous membranes, no thyromegaly, no cervical lymphadenopathy Cardiovascular: RRR, No MRG Respiratory: CTA B Gastrointestinal: abdomen soft, NT, ND, BS+ Musculoskeletal: no deformities, strength intact in all 4 Skin: moist, warm, no rashes Neurological: no tremor with outstretched hands, DTR normal in all 4   ASSESSMENT: 1.Graves' disease  PLAN:  1. Patient with thyrotoxicosis diagnosed in spring/2018, with mild thyrotoxic symptoms: Mild weight loss, some heat intolerance, palpitations, anxiety, and tremors, and diagnosed with Graves' disease last summer after her TSI antibodies and her thyrotropin receptor antibodies returned positive.  Around that time, she also was taking iodine supplements and had an upper respiratory  infection/bronchitis. - We started methimazole to which she responded very well so we can reduce the dose gradually to 5 mg daily at last visit, 6 months ago, and then to 2.5 mg daily after the last TFT checked 3 months ago.    - Her thyrotoxic symptoms resolved on methimazole.  She has no side effects from the medication, but still has weight gain. - we discussed about the latest studies showing that low-dose methimazole is efficient to keep Graves' disease from recurring and also safe even if continued for years. - We did not start a beta-blocker since she was not tachycardic or tremulous at last visit. However, she tells me she was offered beta blockers by cardiology but she refused as her palpitations only occur if she is tired, after alcohol, after stress, etc. - At this visit, we will recheck her TFTs and change the methimazole dose accordingly. - RTC in 6 months, but likely sooner for  labs labs  - time spent with the patient: 15 minutes, of which >50% was spent in obtaining information about her symptoms, reviewing her previous labs, evaluations, and treatments, counseling her about her condition (please see the discussed topics above), and developing a plan to further investigate it; she had a number of questions which I addressed.  Needs refills.  Component     Latest Ref Rng & Units 10/08/2017  TSH     0.35 - 4.50 uIU/mL 3.31  Triiodothyronine,Free,Serum     2.3 - 4.2 pg/mL 3.3  T4,Free(Direct)     0.60 - 1.60 ng/dL 0.74   TFTs are normal, with a TSH closer to the upper limit of normal.  Therefore, we can stop the methimazole and recheck her testing 1.5 months.  Philemon Kingdom, MD PhD [facility] Endocr[facility]

## 2017-10-08 NOTE — Patient Instructions (Signed)
Please continue methimazole 2.5 mg daily.  Please stop at the lab.  Please come back for a follow-up appointment in 9 months.

## 2017-11-30 ENCOUNTER — Encounter: Payer: Self-pay | Admitting: Family Medicine

## 2017-12-03 ENCOUNTER — Other Ambulatory Visit: Payer: Self-pay | Admitting: Emergency Medicine

## 2018-01-15 DIAGNOSIS — Z01419 Encounter for gynecological examination (general) (routine) without abnormal findings: Secondary | ICD-10-CM | POA: Diagnosis not present

## 2018-01-22 DIAGNOSIS — N83201 Unspecified ovarian cyst, right side: Secondary | ICD-10-CM | POA: Diagnosis not present

## 2018-01-22 DIAGNOSIS — Z124 Encounter for screening for malignant neoplasm of cervix: Secondary | ICD-10-CM | POA: Diagnosis not present

## 2018-01-22 DIAGNOSIS — N938 Other specified abnormal uterine and vaginal bleeding: Secondary | ICD-10-CM | POA: Diagnosis not present

## 2018-01-22 DIAGNOSIS — N83202 Unspecified ovarian cyst, left side: Secondary | ICD-10-CM | POA: Diagnosis not present

## 2018-01-23 ENCOUNTER — Encounter: Payer: Self-pay | Admitting: Internal Medicine

## 2018-01-24 ENCOUNTER — Other Ambulatory Visit (INDEPENDENT_AMBULATORY_CARE_PROVIDER_SITE_OTHER): Payer: 59

## 2018-01-24 DIAGNOSIS — E0500 Thyrotoxicosis with diffuse goiter without thyrotoxic crisis or storm: Secondary | ICD-10-CM

## 2018-01-24 LAB — T4, FREE: FREE T4: 0.84 ng/dL (ref 0.60–1.60)

## 2018-01-24 LAB — T3, FREE: T3 FREE: 3.3 pg/mL (ref 2.3–4.2)

## 2018-01-24 LAB — TSH: TSH: 2.09 u[IU]/mL (ref 0.35–4.50)

## 2018-02-17 DIAGNOSIS — G43909 Migraine, unspecified, not intractable, without status migrainosus: Secondary | ICD-10-CM | POA: Diagnosis not present

## 2018-03-10 ENCOUNTER — Other Ambulatory Visit: Payer: Self-pay | Admitting: Obstetrics and Gynecology

## 2018-03-10 DIAGNOSIS — Z1231 Encounter for screening mammogram for malignant neoplasm of breast: Secondary | ICD-10-CM

## 2018-04-18 ENCOUNTER — Ambulatory Visit
Admission: RE | Admit: 2018-04-18 | Discharge: 2018-04-18 | Disposition: A | Discharge status: Home / Self Care | Payer: 59 | Source: Ambulatory Visit | Attending: Obstetrics and Gynecology | Admitting: Obstetrics and Gynecology

## 2018-04-18 DIAGNOSIS — Z1231 Encounter for screening mammogram for malignant neoplasm of breast: Secondary | ICD-10-CM | POA: Diagnosis not present

## 2018-05-07 DIAGNOSIS — N83209 Unspecified ovarian cyst, unspecified side: Secondary | ICD-10-CM | POA: Diagnosis not present

## 2018-05-07 DIAGNOSIS — N938 Other specified abnormal uterine and vaginal bleeding: Secondary | ICD-10-CM | POA: Diagnosis not present

## 2018-05-08 DIAGNOSIS — N83202 Unspecified ovarian cyst, left side: Secondary | ICD-10-CM | POA: Diagnosis not present

## 2018-05-08 DIAGNOSIS — N83201 Unspecified ovarian cyst, right side: Secondary | ICD-10-CM | POA: Diagnosis not present

## 2018-05-15 ENCOUNTER — Encounter: Payer: Self-pay | Admitting: Internal Medicine

## 2018-05-15 ENCOUNTER — Other Ambulatory Visit: Payer: Self-pay

## 2018-05-15 ENCOUNTER — Encounter: Payer: Self-pay | Admitting: Family Medicine

## 2018-05-15 MED ORDER — METHIMAZOLE 5 MG PO TABS: 2.5000 mg | ORAL_TABLET | Freq: Every day | ORAL | 1 refills | Status: DC

## 2018-05-15 NOTE — Telephone Encounter (Signed)
Last OV 10/08/17 medication was stopped due to normal level.  Please advise.

## 2018-05-16 ENCOUNTER — Ambulatory Visit (INDEPENDENT_AMBULATORY_CARE_PROVIDER_SITE_OTHER): Payer: 59

## 2018-05-16 DIAGNOSIS — Z23 Encounter for immunization: Secondary | ICD-10-CM | POA: Diagnosis not present

## 2018-05-16 NOTE — Progress Notes (Signed)
Patient came in today for a flu shot She tolerated the injection well in her left arm

## 2018-07-08 ENCOUNTER — Ambulatory Visit: Payer: 59 | Admitting: Internal Medicine

## 2018-07-16 ENCOUNTER — Ambulatory Visit: Payer: 59 | Admitting: Internal Medicine

## 2018-07-16 ENCOUNTER — Encounter: Payer: Self-pay | Admitting: Internal Medicine

## 2018-07-16 VITALS — BP 136/80 | HR 70 | Ht 68.0 in | Wt 173.0 lb

## 2018-07-16 DIAGNOSIS — E0500 Thyrotoxicosis with diffuse goiter without thyrotoxic crisis or storm: Secondary | ICD-10-CM | POA: Diagnosis not present

## 2018-07-16 LAB — TSH: TSH: 2.63 u[IU]/mL (ref 0.35–4.50)

## 2018-07-16 LAB — T4, FREE: FREE T4: 0.68 ng/dL (ref 0.60–1.60)

## 2018-07-16 LAB — T3, FREE: T3 FREE: 3.5 pg/mL (ref 2.3–4.2)

## 2018-07-16 MED ORDER — METHIMAZOLE 5 MG PO TABS: 2.5000 mg | ORAL_TABLET | Freq: Every day | ORAL | 1 refills | Status: DC

## 2018-07-16 NOTE — Patient Instructions (Signed)
Please continue Methimazole 2.5 mg daily.  Please stop at the lab.  Please come back for a follow-up appointment in 6 months, but likely sooner for labs.

## 2018-07-16 NOTE — Progress Notes (Signed)
Patient ID: Whitney Bowman, female   DOB: 03-Oct-1970, 47 y.o.   MRN: [id number]    HPI  Whitney Bowman is a 47 y.o.-year-old female, initially referred by her PCP, Dr. Lorelei Pont for evaluation for Graves' disease. Last visit 9 months ago.  She has meno-metrorrhagia >> started on OCPs >> weight higher.  She did not stop the methimazole as advised at last visit as she did not remember reading the MyChart message...  Reviewed and addended history: Pt's TFTs were found to be abnormal this Spring- during an APE. Retrospectively, she had tachycardia for 10 years after the birth of her second child >> palpitations.  She started taking a cleanse supplement with iodine, kelp, seaweed (list of ingredients - scanned) in mid-07/2016 >> taking it for 2 months when she had labs in 09/2016.  She stopped these right after the labs returned.  She remembers having had a URI  in 06/2017: Bronchitis/sinusitis.  We started methimazole 5 mg twice daily.  We were able to decrease the dose to 2.5 mg daily in 06/2017 and I advised her to stop the methimazole completely in 09/2017.  She read the message through my chart but did not stop the medication.  She continued 2.5 mg daily.  A subsequent TSH was slightly improved, in 12/2017.  She continues on this dose now.  TFTs remains normal: Lab Results  Component Value Date   TSH 2.09 01/24/2018   TSH 3.31 10/08/2017   TSH 2.38 07/11/2017   TSH 4.47 05/23/2017   TSH 3.26 04/08/2017   TSH 0.33 (L) 02/15/2017   TSH <0.01 (L) 12/21/2016   TSH <0.01 (L) 10/19/2016   TSH <0.01 (L) 10/03/2016   FREET4 0.84 01/24/2018   FREET4 0.74 10/08/2017   FREET4 0.76 07/11/2017   FREET4 0.73 05/23/2017   FREET4 0.64 04/08/2017   FREET4 0.70 02/15/2017   FREET4 1.4 12/21/2016   T3FREE 3.3 01/24/2018   T3FREE 3.3 10/08/2017   T3FREE 3.6 07/11/2017   T3FREE 3.1 05/23/2017   T3FREE 3.1 04/08/2017   T3FREE 2.9 02/15/2017   T3FREE 4.1 12/21/2016   T3FREE 5.7 (H) 10/19/2016    Graves' antibodies were elevated: Lab Results  Component Value Date   TSI 241 (H) 12/21/2016   Her thyrotropin receptor antibodies were elevated: Component     Latest Ref Rng & Units 10/25/2016  Thyrotropin Receptor Ab     <=16.0 % 40.5 (H)    Pt denies: - feeling nodules in neck - hoarseness - dysphagia - choking - SOB with lying down  She initially had palpitations (after her second pregnancy: Saw cardiology; had a Holter monitor), insomnia, fatigue, initially weight loss but she has gained weight afterwards, anxiety, excessive sweating and heat intolerance, mild occasional tremors.  The symptoms are resolved except for weight gain.  Pt does not have a FH of thyroid ds. No FH of thyroid cancer. No h/o radiation tx to head or neck.  No herbal supplements. No Biotin use. No recent steroids use. She has family history of RA.  Had tennis elbow since last visit 2/2 sleeping position. Improved now.  She takes a multivitamin in the morning.  ROS: Constitutional: + weight gain (7 lbs)/no weight loss, no fatigue, no subjective hyperthermia, no subjective hypothermia Eyes: no blurry vision, no xerophthalmia ENT: no sore throat, + see HPI Cardiovascular: no CP/no SOB/no palpitations/no leg swelling Respiratory: no cough/no SOB/no wheezing Gastrointestinal: no N/no V/no D/no C/no acid reflux Musculoskeletal: no muscle aches/no joint aches Skin: no rashes,  no hair loss Neurological: no tremors/no numbness/no tingling/no dizziness  I reviewed pt's medications, allergies, PMH, social hx, family hx, and changes were documented in the history of present illness. Otherwise, unchanged from my initial visit note.   Past Medical History:  Diagnosis Date  . History of positive PPD   . Palpitations    Past Surgical History:  Procedure Laterality Date  . HERNIA REPAIR     Social History   Social History  . Marital status: Married    Spouse name: N/A  . Number of children: 2 sons    Occupational History  . 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 Smoking status: Never Smoker  . Smokeless tobacco: Never Used  . Alcohol use Yes     Comment: Wine, 3 times a week   . Drug use: No   Current Outpatient Medications on File Prior to Visit  Medication Sig Dispense Refill  . cyclobenzaprine (FLEXERIL) 5 MG tablet Take 5 mg by mouth as needed (muscle spasms/cramps).     . methimazole (TAPAZOLE) 5 MG tablet Take 0.5 tablets (2.5 mg total) by mouth daily. 90 tablet 1  . Multiple Vitamin (MULTIVITAMIN) tablet Take 1 tablet by mouth daily.    Marland Kitchen BLISOVI FE 1/20 1-20 MG-MCG tablet      No current facility-administered medications on file prior to visit.    Allergies  Allergen Reactions  . Cefdinir     REACTION: PROTRACTED DIARRHEA/ COLITIS  . Shellfish Allergy Diarrhea and Nausea Only  . Sudafed [Pseudoephedrine Hcl]     Tachycardia  . Cephalosporins   . Codeine Nausea And Vomiting   Family History  Problem Relation Age of Onset  . Hypertension Mother   . Hypertension Father   Mother and father also had melanoma.  PE: BP 136/80   Pulse 70   Ht 5' 8"  (1.727 m)   Wt 173 lb (78.5 kg)   LMP 07/12/2018   SpO2 98%   BMI 26.30 kg/m  Wt Readings from Last 3 Encounters:  07/16/18 173 lb (78.5 kg)  10/08/17 166 lb 12.8 oz (75.7 kg)  08/19/17 164 lb (74.4 kg)   Constitutional: slightly overweight, in NAD Eyes: PERRLA, EOMI, no exophthalmos ENT: moist mucous membranes, no thyromegaly, no cervical lymphadenopathy Cardiovascular: RRR, No MRG Respiratory: CTA B Gastrointestinal: abdomen soft, NT, ND, BS+ Musculoskeletal: no deformities, strength intact in all 4 Skin: moist, warm, no rashes Neurological: no tremor with outstretched hands, DTR normal in all 4  ASSESSMENT: 1.Graves' disease  PLAN:  1. Patient with Whitney Bowman' disease, diagnosed in 2018, with mild thyrotoxic symptoms: Mild weight loss, some heat intolerance, palpitations,  anxiety, tremor.  Her TSI antibodies and her thyrotropin receptor antibodies returned positive.  Around that time, she was also taking iodine supplements and had an upper respiratory infection.  Repeat TFTs were still abnormal so we started methimazole at 5 mg twice a day, then reduce to 5 mg daily and to 2.5 mg daily in 06/2017.  Her TSH was in the upper range of normal at last visit and, because of weight gain, I advised her to stop methimazole and come back for recheck in 1.5 months.  She did not stop the medication as she forgot reading the message.  She continues on 2.5 mg daily and a TSH obtained 1.5 months later was normal, lower.  She continues on the same dose of methimazole, 2.5 mg daily. -At this visit, she continues to have weight gain (  7 pounds) but she attributes this to oral contraceptives that were started for continuous menstrual bleeding -At this visit, we will recheck her TFTs but plan to stop methimazole if possible -We will need a new set of TFTs in 1.5 months after stopping methimazole -I will also add TSI antibody level to check for Graves' activity.  I explained that the risk of Graves' recurrence is higher with positive antibodies. -She continues to have occasional palpitations (pulse is normal at this visit) and cardiology offered a beta-blocker, but she refused since palpitations do not occur every day but only when she is tired, after alcohol, after stress. -I will have her return in 6 months, but sooner for labs.  - time spent with the patient: 15 minutes, of which >50% was spent in obtaining information about her symptoms, reviewing her previous labs, evaluations, and treatments, counseling her about her condition (please see the discussed topics above), and developing a plan to further investigate and treat it.  Office Visit on 07/16/2018  Component Date Value Ref Range Status  . TSH 07/16/2018 2.63  0.35 - 4.50 uIU/mL Final  . Free T4 07/16/2018 0.68  0.60 - 1.60 ng/dL  Final   Comment: Specimens from patients who are undergoing biotin therapy and /or ingesting biotin supplements may contain high levels of biotin.  The higher biotin concentration in these specimens interferes with this Free T4 assay.  Specimens that contain high levels  of biotin may cause false high results for this Free T4 assay.  Please interpret results in light of the total clinical presentation of the patient.    . T3, Free 07/16/2018 3.5  2.3 - 4.2 pg/mL Final  . TSI 07/16/2018 <89  <140 % baseline Final   Comment: . Thyroid stimulating immunoglobulins (TSI) can engage the TSH receptors resulting in hyperthyroidism in Graves' disease patients. TSI levels can be useful in monitoring the clinical outcome of Graves' disease as well as assessing the potential for hyperthyroidism from maternal-fetal transfer. TSI results greater than or equal to (>=) 140% of the Reference Control are considered positive. Marland Kitchen NOTE: A serum TSH level greater than 350 micro-International Units/mL can interfere with the TSI bioassay and potentially give false positive results. . Patients who are pregnant and are suspected of having hyperthyroidism should have both TSI and human Chorionic Gonadotropin(hCG) tests measured. A serum hCG level greater than 40,625 mIU/mL can interfere with the TSI bioassay and may give false negative results. In these patients it is recommended that a second TSI be obtained when the hCG concentration falls below 40,625 mIU/mL (usually after approximately 20-weeks gestation)                          . . The analytical performance characteristics of this assay have been determined by [facility], [place], [place]. The modifications have not been cleared or approved by the FDA. This assay has been validated pursuant to the CLIA regulations and is used for clinical purposes. .    TSI now undetectable. TFTs normal >> will try to stop MMI and recheck  TFTs in 1.5 mo.   Philemon Kingdom, MD PhD [facility] Endocrinology

## 2018-07-18 ENCOUNTER — Encounter: Payer: Self-pay | Admitting: Internal Medicine

## 2018-07-22 LAB — THYROID STIMULATING IMMUNOGLOBULIN

## 2018-07-24 ENCOUNTER — Other Ambulatory Visit: Payer: Self-pay | Admitting: Internal Medicine

## 2018-07-24 DIAGNOSIS — E0500 Thyrotoxicosis with diffuse goiter without thyrotoxic crisis or storm: Secondary | ICD-10-CM

## 2018-08-25 DIAGNOSIS — N83202 Unspecified ovarian cyst, left side: Secondary | ICD-10-CM | POA: Diagnosis not present

## 2018-08-25 DIAGNOSIS — N83201 Unspecified ovarian cyst, right side: Secondary | ICD-10-CM | POA: Diagnosis not present

## 2018-09-02 ENCOUNTER — Other Ambulatory Visit: Payer: Self-pay | Admitting: Internal Medicine

## 2018-09-02 ENCOUNTER — Encounter: Payer: Self-pay | Admitting: Internal Medicine

## 2018-09-02 ENCOUNTER — Other Ambulatory Visit (INDEPENDENT_AMBULATORY_CARE_PROVIDER_SITE_OTHER): Payer: 59

## 2018-09-02 DIAGNOSIS — E0500 Thyrotoxicosis with diffuse goiter without thyrotoxic crisis or storm: Secondary | ICD-10-CM

## 2018-09-02 LAB — T3, FREE: T3, Free: 3.6 pg/mL (ref 2.3–4.2)

## 2018-09-02 LAB — T4, FREE: Free T4: 0.93 ng/dL (ref 0.60–1.60)

## 2018-09-02 LAB — TSH: TSH: 2 u[IU]/mL (ref 0.35–4.50)

## 2018-12-17 ENCOUNTER — Other Ambulatory Visit (INDEPENDENT_AMBULATORY_CARE_PROVIDER_SITE_OTHER): Payer: 59

## 2018-12-17 ENCOUNTER — Other Ambulatory Visit: Payer: Self-pay

## 2018-12-17 DIAGNOSIS — E0500 Thyrotoxicosis with diffuse goiter without thyrotoxic crisis or storm: Secondary | ICD-10-CM | POA: Diagnosis not present

## 2018-12-17 LAB — T4, FREE: Free T4: 0.97 ng/dL (ref 0.60–1.60)

## 2018-12-17 LAB — TSH: TSH: 1.68 u[IU]/mL (ref 0.35–4.50)

## 2018-12-17 LAB — T3, FREE: T3, Free: 3.3 pg/mL (ref 2.3–4.2)

## 2019-02-17 ENCOUNTER — Encounter: Payer: Self-pay | Admitting: Family Medicine

## 2019-02-21 NOTE — Progress Notes (Addendum)
[place] [facility] [address] [place], [place] [address] [phone]  Date:  02/23/2019   Name:  Whitney Bowman   DOB:  July 19, 1971   MRN:  [id number]  PCP:  Darreld Mclean, MD    Chief Complaint: Hypertension (changed to progesterone only, dystolic in 33I)   History of Present Illness:  Whitney Bowman is a 48 y.o. very pleasant female patient who presents with the following:  Following up today to discuss BP   Married to Legrand Como, she is a Programmer, multimedia  I last saw her in 07/2017 when she had the flu She also does see Dr Cruzita Lederer for her Graves disease - was able to stop methimazole this spring and recent TSH was ok   Lab Results  Component Value Date   TSH 1.68 12/17/2018   No recent non- thyroid labs on chart Tetanus recently given by her OBG- tdap per pt   Her OBG put her back on OCP a year or so ago due to ovarian cysts- in June her BP was elevated to about 138/92. They changed her to a POP about 6 weeks ago due to BP elevation  She is checking her BP at home and notes that her DBP has been about 90, her SBP may vary from 120-130s  She is trying to exercise a bit more and notes normal exercise tolerance  No CP or SOB  Never treated for HTN in the past   BP Readings from Last 3 Encounters:  02/23/19 128/90  07/16/18 136/80  10/08/17 124/72   Both of her parents have HTN- her mother recently did pass away at the age of 54.  She had a ?aortic dissection and died in the OR.  She is not my pt so I did not look up her chart.  However Whitney Bowman wonders if this might a genetic concern, and would like to follow-up with her cardiologist just in case.  She saw Dr Radford Pax in 2015 and had a full cardiac eval  Her dad is really struggling- they were married for 71 years  Her father lives near to her so she is able to keep a close eye on him Whitney Bowman feels that the shock of her mom's death is still settling in, she is not  depressed however   Pt has never smoked - her mother also did not smoke   They recently did get a new rat terrier puppy who has been a bright spot  Patient Active Problem List   Diagnosis Date Noted  . Graves disease 12/03/2016  . Chest pain 11/13/2013  . CAP (community acquired pneumonia) 08/22/2012  . 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 History of positive PPD   . Palpitations     Past Surgical History:  Procedure Laterality Date  . HERNIA REPAIR      Social History   Tobacco Use  . Smoking status: Never Smoker  . Smokeless tobacco: Never Used  Substance Use Topics  . Alcohol use: Yes    Comment: Social   . Drug use: No    Family History  Problem Relation Age of Onset  . Hypertension Mother   . Hypertension Father     Allergies  Allergen Reactions  . Cefdinir     REACTION: PROTRACTED DIARRHEA/ COLITIS  . Shellfish Allergy Diarrhea and Nausea Only  . Sudafed [Pseudoephedrine Hcl]  Tachycardia  . Cephalosporins   . Codeine Nausea And Vomiting    Medication list has been reviewed and updated.  Current Outpatient Medications on File Prior to Visit  Medication Sig Dispense Refill  . cyclobenzaprine (FLEXERIL) 5 MG tablet Take 5 mg by mouth as needed (muscle spasms/cramps).     . Multiple Vitamin (MULTIVITAMIN) tablet Take 1 tablet by mouth daily.    . norethindrone (MICRONOR) 0.35 MG tablet Take 1 tablet by mouth daily.     No current facility-administered medications on file prior to visit.     Review of Systems:  As per HPI- otherwise negative. Recent TSH normal   Wt Readings from Last 3 Encounters:  02/23/19 168 lb (76.2 kg)  07/16/18 173 lb (78.5 kg)  10/08/17 166 lb 12.8 oz (75.7 kg)    Physical Examination: Vitals:   02/23/19 0823  BP: 128/90  Pulse: 84  Resp: 16  Temp: 98.1 F (36.7 C)  SpO2: 100%   Vitals:   02/23/19 0823  Weight: 168 lb (76.2 kg)  Height: 5' 8"   (1.727 m)   Body mass index is 25.54 kg/m. Ideal Body Weight: Weight in (lb) to have BMI = 25: 164.1  GEN: WDWN, NAD, Non-toxic, A & O x 3, normal weight, looks well  HEENT: Atraumatic, Normocephalic. Neck supple. No masses, No LAD. Ears and Nose: No external deformity. CV: RRR, No M/G/R. No JVD. No thrill. No extra heart sounds. PULM: CTA B, no wheezes, crackles, rhonchi. No retractions. No resp. distress. No accessory muscle use. ABD: S, NT, ND, +BS. No rebound. No HSM. EXTR: No c/c/e NEURO Normal gait.  PSYCH: Normally interactive. Conversant. Not depressed or anxious appearing.  Calm demeanor.   EKG- NSR.  Compared with 2015 no significant change noted  Assessment and Plan:   ICD-10-CM   1. Elevated BP without diagnosis of hypertension  R03.0 EKG 12-Lead    amLODipine (NORVASC) 2.5 MG tablet    Ambulatory referral to Cardiology  2. Screening for hyperlipidemia  Z13.220 Lipid panel  3. Screening for deficiency anemia  Z13.0 CBC  4. Screening for diabetes mellitus  Z13.1 Comprehensive metabolic panel    Hemoglobin A1c   Following up today Borderline BP Start on amlodipine 2.5 mg Referral back to cardiology for eval 2/to mother's death  Labs pending as above Tdap updated by GYN   Follow-up: No follow-ups on file.  Meds ordered this encounter  Medications  . amLODipine (NORVASC) 2.5 MG tablet    Sig: Take 1 tablet (2.5 mg total) by mouth daily.    Dispense:  30 tablet    Refill:  11   Orders Placed This Encounter  Procedures  . CBC  . Comprehensive metabolic panel  . Hemoglobin A1c  . Lipid panel  . Ambulatory referral to Cardiology  . EKG 12-Lead    @SIGN @    Signed Lamar Blinks, MD  Received her labs, message to pt  Results for orders placed or performed in visit on 02/23/19  CBC  Result Value Ref Range   WBC 6.5 4.0 - 10.5 K/uL   RBC 4.15 3.87 - 5.11 Mil/uL   Platelets 282.0 150.0 - 400.0 K/uL   Hemoglobin 12.7 12.0 - 15.0 g/dL   HCT 38.0 36.0  - 46.0 %   MCV 91.6 78.0 - 100.0 fl   MCHC 33.4 30.0 - 36.0 g/dL   RDW 13.3 11.5 - 15.5 %  Comprehensive metabolic panel  Result Value Ref Range   Sodium 138 135 -  145 mEq/L   Potassium 4.6 3.5 - 5.1 mEq/L   Chloride 103 96 - 112 mEq/L   CO2 29 19 - 32 mEq/L   Glucose, Bld 89 70 - 99 mg/dL   BUN 16 6 - 23 mg/dL   Creatinine, Ser 0.74 0.40 - 1.20 mg/dL   Total Bilirubin 0.5 0.2 - 1.2 mg/dL   Alkaline Phosphatase 53 39 - 117 U/L   AST 26 0 - 37 U/L   ALT 33 0 - 35 U/L   Total Protein 6.8 6.0 - 8.3 g/dL   Albumin 4.4 3.5 - 5.2 g/dL   Calcium 9.6 8.4 - 10.5 mg/dL   GFR 84.10 >60.00 mL/min  Hemoglobin A1c  Result Value Ref Range   Hgb A1c MFr Bld 5.4 4.6 - 6.5 %  Lipid panel  Result Value Ref Range   Cholesterol 207 (H) 0 - 200 mg/dL   Triglycerides 84.0 0.0 - 149.0 mg/dL   HDL 77.50 >39.00 mg/dL   VLDL 16.8 0.0 - 40.0 mg/dL   LDL Cholesterol 113 (H) 0 - 99 mg/dL   Total CHOL/HDL Ratio 3    NonHDL 129.84

## 2019-02-23 ENCOUNTER — Other Ambulatory Visit: Payer: Self-pay

## 2019-02-23 ENCOUNTER — Encounter: Payer: Self-pay | Admitting: Family Medicine

## 2019-02-23 ENCOUNTER — Ambulatory Visit: Payer: 59 | Admitting: Family Medicine

## 2019-02-23 VITALS — BP 128/90 | HR 84 | Temp 98.1°F | Resp 16 | Ht 68.0 in | Wt 168.0 lb

## 2019-02-23 DIAGNOSIS — R030 Elevated blood-pressure reading, without diagnosis of hypertension: Secondary | ICD-10-CM | POA: Diagnosis not present

## 2019-02-23 DIAGNOSIS — Z130 Encounter for screening for diseases of the blood and blood-forming organs and certain disorders involving the immune mechanism: Secondary | ICD-10-CM

## 2019-02-23 DIAGNOSIS — Z13220 Encounter for screening for lipoid disorders: Secondary | ICD-10-CM | POA: Diagnosis not present

## 2019-02-23 DIAGNOSIS — Z131 Encounter for screening for diabetes mellitus: Secondary | ICD-10-CM | POA: Diagnosis not present

## 2019-02-23 LAB — LIPID PANEL
Cholesterol: 207 mg/dL — ABNORMAL HIGH (ref 0–200)
HDL: 77.5 mg/dL (ref >39.00)
LDL Cholesterol: 113 mg/dL — ABNORMAL HIGH (ref 0–99)
NonHDL: 129.84
Total CHOL/HDL Ratio: 3
Triglycerides: 84 mg/dL (ref 0.0–149.0)
VLDL: 16.8 mg/dL (ref 0.0–40.0)

## 2019-02-23 LAB — CBC
HCT: 38 % (ref 36.0–46.0)
Hemoglobin: 12.7 g/dL (ref 12.0–15.0)
MCHC: 33.4 g/dL (ref 30.0–36.0)
MCV: 91.6 fl (ref 78.0–100.0)
Platelets: 282 10*3/uL (ref 150.0–400.0)
RBC: 4.15 Mil/uL (ref 3.87–5.11)
RDW: 13.3 % (ref 11.5–15.5)
WBC: 6.5 10*3/uL (ref 4.0–10.5)

## 2019-02-23 LAB — COMPREHENSIVE METABOLIC PANEL
ALT: 33 U/L (ref 0–35)
AST: 26 U/L (ref 0–37)
Albumin: 4.4 g/dL (ref 3.5–5.2)
Alkaline Phosphatase: 53 U/L (ref 39–117)
BUN: 16 mg/dL (ref 6–23)
CO2: 29 mEq/L (ref 19–32)
Calcium: 9.6 mg/dL (ref 8.4–10.5)
Chloride: 103 mEq/L (ref 96–112)
Creatinine, Ser: 0.74 mg/dL (ref 0.40–1.20)
GFR: 84.1 mL/min (ref >60.00)
Glucose, Bld: 89 mg/dL (ref 70–99)
Potassium: 4.6 mEq/L (ref 3.5–5.1)
Sodium: 138 mEq/L (ref 135–145)
Total Bilirubin: 0.5 mg/dL (ref 0.2–1.2)
Total Protein: 6.8 g/dL (ref 6.0–8.3)

## 2019-02-23 LAB — HEMOGLOBIN A1C: Hgb A1c MFr Bld: 5.4 % (ref 4.6–6.5)

## 2019-02-23 MED ORDER — AMLODIPINE BESYLATE 2.5 MG PO TABS: 2.5000 mg | ORAL_TABLET | Freq: Every day | ORAL | 11 refills | Status: DC

## 2019-02-23 NOTE — Patient Instructions (Signed)
It was great to see you again today- take care and I will be in touch with your labs asap We will start you on amlodipine 2.5 mg daily BP goal is under about 130/85; please monitor your BP at home and let me know how it looks  I put in a referral to Dr. Radford Pax for you- they should call you.  If not please contact them at  [phone], [place], [place] [address] [phone] I am so sorry for the recent loss of your mom.  Let me know if you are not doing ok in processing your grief

## 2019-03-04 ENCOUNTER — Encounter: Payer: Self-pay | Admitting: Family Medicine

## 2019-03-05 ENCOUNTER — Other Ambulatory Visit: Payer: Self-pay | Admitting: Obstetrics and Gynecology

## 2019-03-05 DIAGNOSIS — Z1231 Encounter for screening mammogram for malignant neoplasm of breast: Secondary | ICD-10-CM

## 2019-03-18 ENCOUNTER — Encounter: Payer: Self-pay | Admitting: Family Medicine

## 2019-04-09 ENCOUNTER — Encounter: Payer: Self-pay | Admitting: Family Medicine

## 2019-04-21 NOTE — Progress Notes (Signed)
Cardiology Office Note    Date:  04/22/2019   ID:  Whitney Bowman, DOB 09-17-1970, MRN [id number]  PCP:  Darreld Mclean, MD  Cardiologist:  Fransico Him, MD   Chief Complaint  Patient presents with  . New Patient (Initial Visit)    HTN    History of Present Illness:  Whitney Bowman is a 48 y.o. female who is being seen today for the evaluation of HTN and fm hx of aortic dissection at the request of Copland, Gay Filler, MD.  This is a 48yo female with a hx of palpitations and sinus tachycardia first evaluated in 2010 and then again in 2015 for CP with normal ETT and echo.  She is now referred back again for evaluation of HTN.  Since I saw her last she was dx with Grave's disease and was placed on Methimazole but subsequently stopped.  She was seen by Endocrinology last Dec and was complaining of palpitations and is now referred for evaluation. She apparently was placed on OCP a year ago due to ovarian cysts and this past June her BP was elevated at 138/69mhg.  Her OCP was changed to a POP and then she was seen back by her PCP and BP improved.  Due to a fm hx of ? Aortic dissection in her mom who died in the OR,  the patient was concerned about the BP.  She tells me that her mom presented with CP with negative ER workup several times and then came to ER with severe SOB and CP and was dx with ascending aortic dissection and what sounds like cardiac tamponade.  She died in the OR of a massive CVA.  Her mom also had an iliac artery aneurysm and her moms dad had a descending aortic dissection and iliac aneurysms.    She is here today for followup and is doing well.  She denies any chest pain or pressure, SOB, DOE, PND, orthopnea, LE edema, dizziness, palpitations or syncope. She is compliant with her meds and is tolerating meds with no SE.    Past Medical History:  Diagnosis Date  . History of positive PPD   . Palpitations     Past Surgical History:  Procedure Laterality Date  .  HERNIA REPAIR      Current Medications: Current Meds  Medication Sig  . amLODipine (NORVASC) 2.5 MG tablet Take 1 tablet (2.5 mg total) by mouth daily.  . cyclobenzaprine (FLEXERIL) 5 MG tablet Take 5 mg by mouth as needed (muscle spasms/cramps).   . Multiple Vitamin (MULTIVITAMIN) tablet Take 1 tablet by mouth daily.  . norethindrone (CAMILA) 0.35 MG tablet Take 1 tablet by mouth daily.    Allergies:   Cefdinir, Shellfish allergy, Sudafed [pseudoephedrine hcl], Cephalosporins, and Codeine   Social History   Socioeconomic History  . Marital status: Married    Spouse name: Not on file  . Number of children: Not on file  . Years of education: Not on file  . Highest education level: Not on file  Occupational History  . Not on file  Social Needs  . Financial resource strain: Not on file  . Food insecurity    Worry: Not on file    Inability: Not on file  . Transportation needs    Medical: Not on file    Non-medical: Not on file  Tobacco Use  . Smoking status: Never Smoker  . Smokeless tobacco: Never Used  Substance and Sexual Activity  . Alcohol use: Yes  Comment: Social   . Drug use: No  . Sexual activity: Not on file  Lifestyle  . Physical activity    Days per week: Not on file    Minutes per session: Not on file  . Stress: Not on file  Relationships  . Social Herbalist on phone: Not on file    Gets together: Not on file    Attends religious service: Not on file    Active member of club or organization: Not on file    Attends meetings of clubs or organizations: Not on file    Relationship status: Not on file  Other Topics Concern  . Not on file  Social History Narrative  . Not on file     Family History:  The patient's family history includes Hypertension in her father and mother.   ROS:   Please see the history of present illness.    ROS All other systems reviewed and are negative.  No flowsheet data found.     PHYSICAL EXAM:   VS:   There were no vitals taken for this visit.   GEN: Well nourished, well developed, in no acute distress  HEENT: normal  Neck: no JVD, carotid bruits, or masses Cardiac: RRR; no murmurs, rubs, or gallops,no edema.  Intact distal pulses bilaterally.  Respiratory:  clear to auscultation bilaterally, normal work of breathing GI: soft, nontender, nondistended, + BS MS: no deformity or atrophy  Skin: warm and dry, no rash Neuro:  Alert and Oriented x 3, Strength and sensation are intact Psych: euthymic mood, full affect  Wt Readings from Last 3 Encounters:  02/23/19 168 lb (76.2 kg)  07/16/18 173 lb (78.5 kg)  10/08/17 166 lb 12.8 oz (75.7 kg)      Studies/Labs Reviewed:   EKG:  EKG is ordered today.  The ekg ordered today demonstrates NSR with nonspecific T wave abnormality  Recent Labs: 12/17/2018: TSH 1.68 02/23/2019: ALT 33; BUN 16; Creatinine, Ser 0.74; Hemoglobin 12.7; Platelets 282.0; Potassium 4.6; Sodium 138   Lipid Panel    Component Value Date/Time   CHOL 207 (H) 02/23/2019 0917   TRIG 84.0 02/23/2019 0917   HDL 77.50 02/23/2019 0917   CHOLHDL 3 02/23/2019 0917   VLDL 16.8 02/23/2019 0917   LDLCALC 113 (H) 02/23/2019 0917    Additional studies/ records that were reviewed today include:  none    ASSESSMENT:    1. Essential hypertension   2. Family history of aortic dissection      PLAN:  In order of problems listed above:  1.  Hypertension - BP is well controlled. Continue amloidipine 2.22m daily.   2.   Family hx of aortic disease with mother dying of aortic dissection. She also had an iliac artery aneurysm.  Her moms father had a descending aortic aneurysm and iliac aneurysms.  I am concerned that she may have an underlying familial connective tissue d/o.   I will get a screening Chest and abdominal CTA to assess entire aorta and iliac arteries.  I will also send her for genetic counseling.  Her Lipids are at goal.  She will continue on BP meds for BP control  and I have encouraged her to avoid upper extremity weight lifting.  She will see me back in 1 year.     Medication Adjustments/Labs and Tests Ordered: Current medicines are reviewed at length with the patient today.  Concerns regarding medicines are outlined above.  Medication changes, Labs and Tests ordered  today are listed in the Patient Instructions below.  There are no Patient Instructions on file for this visit.   Signed, Fransico Him, MD  04/22/2019 8:46 AM    [address], [place], [place]  [address] Phone: [phone]; Fax: [phone]

## 2019-04-22 ENCOUNTER — Encounter

## 2019-04-22 ENCOUNTER — Ambulatory Visit: Payer: 59 | Admitting: Cardiology

## 2019-04-22 ENCOUNTER — Encounter: Payer: Self-pay | Admitting: Cardiology

## 2019-04-22 ENCOUNTER — Other Ambulatory Visit: Payer: Self-pay

## 2019-04-22 VITALS — BP 118/78 | HR 82 | Ht 68.0 in | Wt 169.0 lb

## 2019-04-22 DIAGNOSIS — Z8249 Family history of ischemic heart disease and other diseases of the circulatory system: Secondary | ICD-10-CM | POA: Diagnosis not present

## 2019-04-22 DIAGNOSIS — I10 Essential (primary) hypertension: Secondary | ICD-10-CM

## 2019-04-22 DIAGNOSIS — I723 Aneurysm of iliac artery: Secondary | ICD-10-CM | POA: Diagnosis not present

## 2019-04-22 NOTE — Patient Instructions (Addendum)
Medication Instructions:  Your physician recommends that you continue on your current medications as directed. Please refer to the Current Medication list given to you today.  If you need a refill on your cardiac medications before your next appointment, please call your pharmacy.   Lab work: None ordered  If you have labs (blood work) drawn today and your tests are completely normal, you will receive your results only by: Marland Kitchen MyChart Message (if you have MyChart) OR . A paper copy in the mail If you have any lab test that is abnormal or we need to change your treatment, we will call you to review the results.  Testing/Procedures: Your provider would like for you to get a CTA Chest and CTA of Abdomen. Non-Cardiac CT Angiography (CTA), is a special type of CT scan that uses a computer to produce multi-dimensional views of major blood vessels throughout the body. In CT angiography, a contrast material is injected through an IV to help visualize the blood vessels    You have been referred to Dr. Broadus John, Genetic Counseling.  Someone will call you to make that appointment.    Follow-Up: At [facility], you and your health needs are our priority.  As part of our continuing mission to provide you with exceptional heart care, we have created designated Provider Care Teams.  These Care Teams include your primary Cardiologist (physician) and Advanced Practice Providers (APPs -  Physician Assistants and Nurse Practitioners) who all work together to provide you with the care you need, when you need it. You will need a follow up appointment in 12 months.  Please call our office 2 months in advance to schedule this appointment.  You may see No primary care provider on file. or one of the following Advanced Practice Providers on your designated Care Team:   Lyda Jester, PA-C Melina Copa, PA-C . Ermalinda Barrios, PA-C  Any Other Special Instructions Will Be Listed Below (If Applicable).  CT  Angiogram  A CT angiogram is a procedure to look at the blood vessels in various areas of the body. For this procedure, a large X-ray machine, called a CT scanner, takes detailed pictures of blood vessels that have been injected with a dye (contrast material). A CT angiogram allows your health care provider to see how well blood is flowing to the area of your body that is being checked. Your health care provider will be able to see if there are any problems, such as a blockage. Tell a health care provider about:  Any allergies you have.  All medicines you are taking, including vitamins, herbs, eye drops, creams, and over-the-counter medicines.  Any problems you or family members have had with anesthetic medicines.  Any blood disorders you have.  Any surgeries you have had.  Any medical conditions you have.  Whether you are pregnant or may be pregnant.  Whether you are breastfeeding.  Any anxiety disorders, chronic pain, or other conditions you have that may increase your stress or prevent you from lying still. What are the risks? Generally, this is a safe procedure. However, problems may occur, including:  Infection.  Bleeding.  Allergic reactions to medicines or dyes.  Damage to other structures or organs.  Kidney damage from the dye or contrast that is used.  Increased risk of cancer from radiation exposure. This risk is low. Talk with your health care provider about: ? The risks and benefits of testing. ? How you can receive the lowest dose of radiation. What happens  before the procedure?  Wear comfortable clothing and remove any jewelry.  Follow instructions from your health care provider about eating and drinking. For most people, instructions may include these actions: ? For 12 hours before the test, avoid caffeine. This includes tea, coffee, soda, and energy drinks or pills. ? For 3-4 hours before the test, stop eating or drinking anything but water. ? Stay well  hydrated by continuing to drink water before the exam. This will help to clear the contrast dye from your body after the test.  Ask your health care provider about changing or stopping your regular medicines. This is especially important if you are taking diabetes medicines or blood thinners. What happens during the procedure?  An IV tube will be inserted into one of your veins.  You will be asked to lie on an exam table. This table will slide in and out of the CT machine during the procedure.  Contrast dye will be injected into the IV tube. You might feel warm, or you may get a metallic taste in your mouth.  The table that you are lying on will move into the CT machine tunnel for the scan.  The person running the machine will give you instructions while the scans are being done. You may be asked to: ? Keep your arms above your head. ? Hold your breath. ? Stay very still, even if the table is moving.  When the scanning is complete, you will be moved out of the machine.  The IV tube will be removed. The procedure may vary among health care providers and hospitals. What happens after the procedure?  You might feel warm, or you may get a metallic taste in your mouth.  You may be asked to drink water or other fluids to wash (flush) the contrast material out of your body.  It is up to you to get the results of your procedure. Ask your health care provider, or the department that is doing the procedure, when your results will be ready. Summary  A CT angiogram is a procedure to look at the blood vessels in various areas of the body.  You will need to stay very still during the exam.  You may be asked to drink water or other fluids to wash (flush) the contrast material out of your body after your scan. This information is not intended to replace advice given to you by your health care provider. Make sure you discuss any questions you have with your health care provider. Document  Released: 03/15/2016 Document Revised: 09/25/2018 Document Reviewed: 03/15/2016 Elsevier Patient Education  2020 [employer].

## 2019-04-22 NOTE — Addendum Note (Signed)
Addended by: Gaetano Net on: 04/22/2019 09:52 AM   Modules accepted: Orders

## 2019-04-28 ENCOUNTER — Other Ambulatory Visit: Payer: Self-pay

## 2019-04-28 ENCOUNTER — Ambulatory Visit
Admission: RE | Admit: 2019-04-28 | Discharge: 2019-04-28 | Disposition: A | Discharge status: Home / Self Care | Payer: 59 | Source: Ambulatory Visit | Attending: Obstetrics and Gynecology | Admitting: Obstetrics and Gynecology

## 2019-04-28 DIAGNOSIS — Z1231 Encounter for screening mammogram for malignant neoplasm of breast: Secondary | ICD-10-CM

## 2019-05-07 ENCOUNTER — Ambulatory Visit: Payer: 59 | Admitting: Family Medicine

## 2019-05-07 ENCOUNTER — Other Ambulatory Visit: Payer: Self-pay

## 2019-05-07 ENCOUNTER — Encounter: Payer: Self-pay | Admitting: Family Medicine

## 2019-05-07 ENCOUNTER — Other Ambulatory Visit: Payer: 59 | Admitting: *Deleted

## 2019-05-07 VITALS — BP 122/80 | HR 78 | Temp 96.4°F | Resp 16 | Ht 68.0 in | Wt 168.0 lb

## 2019-05-07 DIAGNOSIS — H9311 Tinnitus, right ear: Secondary | ICD-10-CM | POA: Diagnosis not present

## 2019-05-07 DIAGNOSIS — I10 Essential (primary) hypertension: Secondary | ICD-10-CM

## 2019-05-07 DIAGNOSIS — I723 Aneurysm of iliac artery: Secondary | ICD-10-CM

## 2019-05-07 DIAGNOSIS — Z8249 Family history of ischemic heart disease and other diseases of the circulatory system: Secondary | ICD-10-CM

## 2019-05-07 LAB — BASIC METABOLIC PANEL
BUN/Creatinine Ratio: 17 (ref 9–23)
BUN: 13 mg/dL (ref 6–24)
CO2: 24 mmol/L (ref 20–29)
Calcium: 9.1 mg/dL (ref 8.7–10.2)
Chloride: 99 mmol/L (ref 96–106)
Creatinine, Ser: 0.77 mg/dL (ref 0.57–1.00)
GFR calc Af Amer: 106 mL/min/{1.73_m2} (ref >59)
GFR calc non Af Amer: 92 mL/min/{1.73_m2} (ref >59)
Glucose: 82 mg/dL (ref 65–99)
Potassium: 4.5 mmol/L (ref 3.5–5.2)
Sodium: 137 mmol/L (ref 134–144)

## 2019-05-07 MED ORDER — PREDNISONE 20 MG PO TABS: ORAL_TABLET | ORAL | 0 refills | Status: DC

## 2019-05-07 NOTE — Patient Instructions (Signed)
Great to see you today- I think your symptoms are coming from eustachian tube dysfunction from swelling between your ear and your nose We will try a course of prednisone for these symptoms Please let me know if you are not doing much better in the next couple of days- Sooner if worse.

## 2019-05-07 NOTE — Progress Notes (Addendum)
[facility] [address], [place], [place] [address] [phone]  Date:  05/07/2019   Name:  Whitney Bowman   DOB:  03-19-1971   MRN:  [id number]  PCP:  Darreld Mclean, MD    Chief Complaint: Sinus Pressure (sinus headache, right ear pressure, dull ache, casuing vertigo) and Ear Pain   History of Present Illness:  Whitney Bowman is a 48 y.o. very pleasant female patient who presents with the following:  Patient with history of elevated blood pressure, here today with a concerned that her ear  She does have history of Graves' disease, no longer taking methimazole on the advice of her endocrinologist Married to Legrand Como, she is a Programmer, multimedia  She has noted pressure and ringing in her right ear - right only- for the lats 2 days  It is making her feel a bit dizzy   No fever No cough She noted a sinus HA yesterday and some of her typical frontal sinus pressure like she might have with allergies Not a pulsatile tinnitus -constant  She is having CT angiography of her chest, abdomen, pelvis next week per Dr. Radford Pax.  They wonder if she may have a genetic connective tissue disorder, as several people in her family have had serious aneurysms  Patient Active Problem List   Diagnosis Date Noted  . Graves disease 12/03/2016  . Chest pain 11/13/2013  . CAP (community acquired pneumonia) 08/22/2012  . 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 History of positive PPD   . Palpitations     Past Surgical History:  Procedure Laterality Date  . HERNIA REPAIR      Social History   Tobacco Use  . Smoking status: Never Smoker  . Smokeless tobacco: Never Used  Substance Use Topics  . Alcohol use: Yes    Comment: Social   . Drug use: No    Family History  Problem Relation Age of Onset  . Hypertension Mother   . Hypertension Father     Allergies  Allergen  Reactions  . Cefdinir     REACTION: PROTRACTED DIARRHEA/ COLITIS  . Shellfish Allergy Diarrhea and Nausea Only  . Sudafed [Pseudoephedrine Hcl]     Tachycardia  . Cephalosporins   . Codeine Nausea And Vomiting    Medication list has been reviewed and updated.  Current Outpatient Medications on File Prior to Visit  Medication Sig Dispense Refill  . amLODipine (NORVASC) 2.5 MG tablet Take 1 tablet (2.5 mg total) by mouth daily. 30 tablet 11  . cyclobenzaprine (FLEXERIL) 5 MG tablet Take 5 mg by mouth as needed (muscle spasms/cramps).     . Multiple Vitamin (MULTIVITAMIN) tablet Take 1 tablet by mouth daily.    . norethindrone (CAMILA) 0.35 MG tablet Take 1 tablet by mouth daily.     No current facility-administered medications on file prior to visit.     Review of Systems:  As per HPI- otherwise negative.   Physical Examination: Vitals:   05/07/19 1602  BP: 122/80  Pulse: 78  Resp: 16  Temp: (!) 96.4 F (35.8 C)  SpO2: 100%   Vitals:   05/07/19 1602  Weight: 168 lb (76.2 kg)  Height: 5' 8"  (1.727 m)   Body mass index is 25.54 kg/m. Ideal Body Weight: Weight in (lb) to have BMI = 25: 164.1  GEN: WDWN, NAD, Non-toxic, A &  O x 3.  Normal weight, looks well HEENT: Atraumatic, Normocephalic. Neck supple. No masses, No LAD.  Bilateral TM wnl, oropharynx normal.  PEERL,EOMI.   Right nasal cavity shows swelling of turbinates and congestion Ears and Nose: No external deformity. CV: RRR, No M/G/R. No JVD. No thrill. No extra heart sounds. PULM: CTA B, no wheezes, crackles, rhonchi. No retractions. No resp. distress. No accessory muscle use. ABD: S, NT, ND, +BS. No rebound. No HSM. EXTR: No c/c/e NEURO Normal gait.  Normal strength, sensation, DTR of all extremities.  Normal Romberg.  No neurologic abnormality is appreciated PSYCH: Normally interactive. Conversant. Not depressed or anxious appearing.  Calm demeanor.    Assessment and Plan: Tinnitus of right ear - Plan:  predniSONE (DELTASONE) 20 MG tablet  Here today with concern of tenderness and pressure in her right ear.  Exam is otherwise normal.  Suspect her symptoms are due to eustachian tube dysfunction.  We will have her try over-the-counter Flonase, and prescribed a short course of prednisone.  I have asked her to let me know if she is not feeling better in the next few days, sooner if she gets worse.  I discussed use of prednisone in this patient with possible connective tissue disorder with Pharm.D. at [facility], no significant elevated risk anticipated  Avoid NSAIDs while taking prednisone  Signed Lamar Blinks, MD

## 2019-05-12 ENCOUNTER — Ambulatory Visit: Payer: 59

## 2019-05-14 ENCOUNTER — Ambulatory Visit (INDEPENDENT_AMBULATORY_CARE_PROVIDER_SITE_OTHER)
Admission: RE | Admit: 2019-05-14 | Discharge: 2019-05-14 | Disposition: A | Discharge status: Home / Self Care | Payer: 59 | Source: Ambulatory Visit | Attending: Cardiology | Admitting: Cardiology

## 2019-05-14 ENCOUNTER — Telehealth: Payer: Self-pay

## 2019-05-14 ENCOUNTER — Other Ambulatory Visit: Payer: Self-pay

## 2019-05-14 DIAGNOSIS — I723 Aneurysm of iliac artery: Secondary | ICD-10-CM

## 2019-05-14 DIAGNOSIS — Z8249 Family history of ischemic heart disease and other diseases of the circulatory system: Secondary | ICD-10-CM

## 2019-05-14 MED ORDER — IOHEXOL 350 MG/ML SOLN
100.0000 mL | Freq: Once | INTRAVENOUS | Status: AC | PRN
  Administered 2019-05-14: 100 mL via INTRAVENOUS

## 2019-05-14 NOTE — Telephone Encounter (Signed)
-----   Message from Sueanne Margarita, MD sent at 05/14/2019  3:28 PM EDT ----- CT of chest, abdomen and pelvis showed no aneurysm or dissection.  There was ? Pelvic congestion syndrome - please forward to PCP for followup

## 2019-05-14 NOTE — Telephone Encounter (Signed)
Notes recorded by Frederik Schmidt, RN on 05/14/2019 at 4:13 PM EDT  The patient has been notified of the result and verbalized understanding. All questions (if any) were answered.  Frederik Schmidt, RN 05/14/2019 4:13 PM

## 2019-05-18 ENCOUNTER — Encounter: Payer: Self-pay | Admitting: Family Medicine

## 2019-05-21 ENCOUNTER — Other Ambulatory Visit: Payer: Self-pay

## 2019-05-21 ENCOUNTER — Ambulatory Visit (INDEPENDENT_AMBULATORY_CARE_PROVIDER_SITE_OTHER): Payer: 59

## 2019-05-21 DIAGNOSIS — Z23 Encounter for immunization: Secondary | ICD-10-CM

## 2019-07-09 ENCOUNTER — Other Ambulatory Visit: Payer: Self-pay

## 2019-07-09 ENCOUNTER — Ambulatory Visit: Payer: 59 | Admitting: Genetic Counselor

## 2019-07-13 ENCOUNTER — Other Ambulatory Visit: Payer: Self-pay | Admitting: *Deleted

## 2019-07-13 DIAGNOSIS — Z8249 Family history of ischemic heart disease and other diseases of the circulatory system: Secondary | ICD-10-CM

## 2019-07-13 DIAGNOSIS — I10 Essential (primary) hypertension: Secondary | ICD-10-CM

## 2019-07-13 NOTE — Progress Notes (Signed)
Please order 2D echo to assess for HOCM - fm hx of HOCM   Traci   Message text    Order placed per Dr Radford Pax.

## 2019-07-20 NOTE — Progress Notes (Signed)
Pre-test GC notes   Referring Provider: Fransico Him, MD  Referral Reason  Kjersti Dittmer was referred for genetic consult of aortopathies in light of the significant family history of aortic aneursyms and dissection.    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 She reports having tachycardia. At age 69, she began having fainting spells that were later attributed to her borderline anemia and was resolved by iron supplements. She denies having symptoms related to heart disease.   Her 5-generation family history was obtained. See details below.  Family history Bettina (IV.3) has two healthy boys, ages 26 and 72 (V.3, V.4). Her older brother is 19 (IV.2) with hypertension and has two boys ages 62 and 78 (V.1, V.2).   There is significant history of aortic aneurysms and dissections in Gustava's maternal relatives. Ednamae's mother (III.3) was diagnosed with HCM at age 64. She brings in her mother's medical records that indicate severe basal septal hypertrophy as per her cardiac MRI.  She also had a history of syncope in the past, hypertension, a stable left common iliac artery aneurysm and aortic atherosclerosis.  She had symptoms of chest pains and dizziness for as long as Kalenna can remember. She had dyspnea and extreme fatigue that got worse in June of this year. Her mother had an ascending aortic dissection at age 63 and died from a massive stroke during surgery to repair the dissection. Mom had hypertension for the last 15 years that was well controlled.  Glee's maternal grandfather (II.4) was found to have an aneurysm in his femoral artery and another in his abdominal aorta at age 5. The femoral artery aneurysm was repaired. Later, at age 79, he suffered dissection of his descending aorta that lead to poor quality of life. He began experiencing chest pains soon after and was taken to the hospital where he passed  away. His sister (II.3) was also diagnosed with an aneurysm in her 76s and passed away at age 67. Interestingly, Latonyia tells me that her maternal great grandmother (I.2) was diagnosed with HCM and lived to age 49. She is not aware of her maternal grandmother (II.5) having heart disease- she lived to 20.   She does not report heart disease in her father (III.2) or his sibling (III.1). Paternal grandmother died suddenly at 29.   Impression  In summary, Tanara reports a first-degree relative (mom) with HCM and death due to an aortic dissection. In addition she reports HCM in her maternal great grandmother (a 3rd-degree relative) and aneurysms in her maternal grandfather (2nd degree relative) and his sister (3rd degree-relative). It is highly unusual for a person to have two inherited cardiac conditions. Nevertheless, both conditions are autosomal dominant and places Ralene at a 50% risk of inheriting HCM or aortic dissection and aneurysm. She verbalized understanding of this.  I informed her that current guidelines stipulate that she undergo cardiac screening (Echo and EKG) for HCM. If her heart wall thickness is suggestive of HCM, then she can undergo genetic testing to confirm her diagnosis and stratify risk if HCM in her family. Likewise for aortic aneurysms. She acknowledged this. Please note that the patient has not been counseled in this visit on personal, cultural or ethical issues that he may face due to his heart condition.     Lattie Corns, Ph.D, [facility] Clinical Molecular Geneticist

## 2019-07-21 ENCOUNTER — Other Ambulatory Visit: Payer: Self-pay

## 2019-07-21 ENCOUNTER — Ambulatory Visit (HOSPITAL_COMMUNITY): Payer: 59 | Attending: Cardiology

## 2019-07-21 DIAGNOSIS — I10 Essential (primary) hypertension: Secondary | ICD-10-CM

## 2019-07-21 DIAGNOSIS — Z8249 Family history of ischemic heart disease and other diseases of the circulatory system: Secondary | ICD-10-CM | POA: Diagnosis present

## 2019-07-21 NOTE — Progress Notes (Signed)
Echo showed normal LVF with trivial leakiness of the MV and mildly leaky TV both of which are within normal limits

## 2019-10-04 ENCOUNTER — Ambulatory Visit: Payer: 59 | Attending: Internal Medicine

## 2019-10-04 DIAGNOSIS — Z23 Encounter for immunization: Secondary | ICD-10-CM | POA: Insufficient documentation

## 2019-10-04 NOTE — Progress Notes (Signed)
   Covid-19 Vaccination Clinic  Name:  Whitney Bowman    MRN: [id number] DOB: 1970-09-10  10/04/2019  [name] was observed post Covid-19 immunization for 15 minutes without incident. She was provided with Vaccine Information Sheet and instruction to access the V-Safe system.   Ms. Single was instructed to call 911 with any severe reactions post vaccine: Marland Kitchen Difficulty breathing  . Swelling of face and throat  . A fast heartbeat  . A bad rash all over body  . Dizziness and weakness   Immunizations Administered    Name Date Dose VIS Date Route   Pfizer COVID-19 Vaccine 10/04/2019  9:42 AM 0.3 mL 07/10/2019 Intramuscular   Manufacturer: [place]   Lot: [id number]   [place]: [id number]

## 2019-10-26 ENCOUNTER — Ambulatory Visit: Payer: 59 | Attending: Internal Medicine

## 2019-10-26 DIAGNOSIS — Z23 Encounter for immunization: Secondary | ICD-10-CM

## 2019-10-26 NOTE — Progress Notes (Signed)
   Covid-19 Vaccination Clinic  Name:  NYARA CAPELL    MRN: [id number] DOB: 25-Feb-1971  10/26/2019  Ms. Wenberg was observed post Covid-19 immunization for 15 minutes without incident. She was provided with Vaccine Information Sheet and instruction to access the V-Safe system.   Ms. Marton was instructed to call 911 with any severe reactions post vaccine: Marland Kitchen Difficulty breathing  . Swelling of face and throat  . A fast heartbeat  . A bad rash all over body  . Dizziness and weakness   Immunizations Administered    Name Date Dose VIS Date Route   Pfizer COVID-19 Vaccine 10/26/2019  8:31 AM 0.3 mL 07/10/2019 Intramuscular   Manufacturer: [place]   Lot: [id number]   [place]: [id number]

## 2019-11-03 ENCOUNTER — Ambulatory Visit: Payer: 59

## 2020-02-16 ENCOUNTER — Other Ambulatory Visit: Payer: Self-pay | Admitting: Family Medicine

## 2020-02-16 DIAGNOSIS — R030 Elevated blood-pressure reading, without diagnosis of hypertension: Secondary | ICD-10-CM

## 2020-02-16 MED ORDER — AMLODIPINE BESYLATE 2.5 MG PO TABS: 2.5000 mg | ORAL_TABLET | Freq: Every day | ORAL | 0 refills | Status: DC

## 2020-02-16 NOTE — Telephone Encounter (Signed)
LM requesting call back. Pt needs f/u appt for BP, 1 month refill sent to pharmacy.

## 2020-02-16 NOTE — Telephone Encounter (Signed)
Medication: amLODipine (NORVASC) 2.5 MG tablet [[id number]   Has the patient contacted their pharmacy? No. (If no, request that the patient contact the pharmacy for the refill.) (If yes, when and what did the pharmacy advise?)  Preferred Pharmacy (with phone number or street name): [facility] [place], [place] - [place]  [address] [place], [place] [address]  Phone:  [phone] Fax:  [phone]  DEA #:  --  Agent: Please be advised that RX refills may take up to 3 business days. We ask that you follow-up with your pharmacy.

## 2020-03-03 DIAGNOSIS — I10 Essential (primary) hypertension: Secondary | ICD-10-CM | POA: Insufficient documentation

## 2020-03-03 NOTE — Patient Instructions (Addendum)
It was great to see you again today!  I will be in touch with your labs soon as possible We will set you up with a Cologuard kit at your home.   Take care!

## 2020-03-03 NOTE — Progress Notes (Addendum)
[place] at [facility] [address] [place], [place] [address] [phone]  Date:  03/07/2020   Name:  Whitney Bowman   DOB:  Mar 22, 1971   MRN:  [id number]  PCP:  Darreld Mclean, MD    Chief Complaint: Medication Refill   History of Present Illness:  Whitney Bowman is a 49 y.o. very pleasant female patient who presents with the following:  Generally healthy woman with history of Graves' disease, palpitations, essential hypertension on amlodipine Last seen by myself in October 2020  She is a Programmer, multimedia, married to Whitney.  They have 2 teenage sons;12 and 61 yo.  They are getting their covid vaccine.  Her Graves' disease is followed by endocrinology, no longer taking methimazole-today patient reports she is actually no longer seeing endocrinology, then I asked her to follow-up further She does have a strong family history of aneurysm and dissection, has been seen by genetic counseling.  They did recommend that she undergo screening for HCM with echo and EKG-eval normal  Pap- her OBG Dr Garwin Brothers has left her practice and is starting her own practice.  Pap per oBG Covid vaccine complete Routine labs due today- she is fasting since b-fast  No close family members with colon cancer. No rectal bleeding Would like to do cologaurd for screening  She is walking for exercise, they got a puppy recently as her walking has increased Never smoker, no excess alcohol Patient Active Problem List   Diagnosis Date Noted  . Essential hypertension 03/03/2020  . Graves disease 12/03/2016  . Chest pain 11/13/2013  . CAP (community acquired pneumonia) 08/22/2012  . 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 History of positive PPD   . Palpitations     Past Surgical History:  Procedure Laterality Date  . HERNIA REPAIR      Social History   Tobacco Use  . Smoking status: Never  Smoker  . Smokeless tobacco: Never Used  Substance Use Topics  . Alcohol use: Yes    Comment: Social   . Drug use: No    Family History  Problem Relation Age of Onset  . Hypertension Mother   . Hypertension Father     Allergies  Allergen Reactions  . Cefdinir     REACTION: PROTRACTED DIARRHEA/ COLITIS  . Shellfish Allergy Diarrhea and Nausea Only  . Sudafed [Pseudoephedrine Hcl]     Tachycardia  . Cephalosporins   . Codeine Nausea And Vomiting    Medication list has been reviewed and updated.  Current Outpatient Medications on File Prior to Visit  Medication Sig Dispense Refill  . cyclobenzaprine (FLEXERIL) 5 MG tablet Take 5 mg by mouth as needed (muscle spasms/cramps).     . Multiple Vitamin (MULTIVITAMIN) tablet Take 1 tablet by mouth daily.     No current facility-administered medications on file prior to visit.    Review of Systems:  As per HPI- otherwise negative.   Physical Examination: Vitals:   03/07/20 1408  BP: 122/80  Pulse: 85  Resp: 15  SpO2: 95%   Vitals:   03/07/20 1408  Weight: 168 lb (76.2 kg)  Height: 5' 8"  (1.727 m)   Body mass index is 25.54 kg/m. Ideal Body Weight: Weight in (lb) to have BMI = 25: 164.1  GEN: no acute distress.  Normal weight, looks well HEENT: Atraumatic, Normocephalic.   Bilateral TM wnl, oropharynx  normal.  PEERL,EOMI.   Ears and Nose: No external deformity. CV: RRR, No M/G/R. No JVD. No thrill. No extra heart sounds. PULM: CTA B, no wheezes, crackles, rhonchi. No retractions. No resp. distress. No accessory muscle use. ABD: S, NT, ND, +BS. No rebound. No HSM. EXTR: No c/c/e PSYCH: Normally interactive. Conversant.    Assessment and Plan: Essential hypertension - Plan: amLODipine (NORVASC) 2.5 MG tablet  Screening for hyperlipidemia - Plan: Lipid panel  Screening for deficiency anemia - Plan: CBC  Screening for diabetes mellitus - Plan: Comprehensive metabolic panel, Hemoglobin A1c  Graves disease -  Plan: TSH, T3, free  Screening for colon cancer  Here today for follow-up visit Blood pressure under good control with current dose of amlodipine-refill for 1 year Labs are otherwise pending as above, will take over checking her TSH and free T3 Ordered Cologuard to screen for colon cancer Will plan further follow- up pending labs. Asked her to follow-up in 6 to 9 months This visit occurred during the SARS-CoV-2 public health emergency.  Safety protocols were in place, including screening questions prior to the visit, additional usage of staff PPE, and extensive cleaning of exam room while observing appropriate contact time as indicated for disinfecting solutions.    Signed Lamar Blinks, MD  addnd 8/10- received labs as below Results for orders placed or performed in visit on 03/07/20  CBC  Result Value Ref Range   WBC 5.6 4.0 - 10.5 K/uL   RBC 3.81 (L) 3.87 - 5.11 Mil/uL   Platelets 257.0 150 - 400 K/uL   Hemoglobin 12.1 12.0 - 15.0 g/dL   HCT 35.1 (L) 36 - 46 %   MCV 92.2 78.0 - 100.0 fl   MCHC 34.4 30.0 - 36.0 g/dL   RDW 12.9 11.5 - 15.5 %  Comprehensive metabolic panel  Result Value Ref Range   Sodium 137 135 - 145 mEq/L   Potassium 4.1 3.5 - 5.1 mEq/L   Chloride 103 96 - 112 mEq/L   CO2 25 19 - 32 mEq/L   Glucose, Bld 90 70 - 99 mg/dL   BUN 10 6 - 23 mg/dL   Creatinine, Ser 0.68 0.40 - 1.20 mg/dL   Total Bilirubin 0.4 0.2 - 1.2 mg/dL   Alkaline Phosphatase 51 39 - 117 U/L   AST 17 0 - 37 U/L   ALT 10 0 - 35 U/L   Total Protein 7.2 6.0 - 8.3 g/dL   Albumin 4.4 3.5 - 5.2 g/dL   GFR 92.31 >60.00 mL/min   Calcium 9.2 8.4 - 10.5 mg/dL  Hemoglobin A1c  Result Value Ref Range   Hgb A1c MFr Bld 5.3 4.6 - 6.5 %  Lipid panel  Result Value Ref Range   Cholesterol 195 0 - 200 mg/dL   Triglycerides 93.0 0 - 149 mg/dL   HDL 73.70 >39.00 mg/dL   VLDL 18.6 0.0 - 40.0 mg/dL   LDL Cholesterol 103 (H) 0 - 99 mg/dL   Total CHOL/HDL Ratio 3    NonHDL 121.73   TSH  Result  Value Ref Range   TSH 0.32 (L) 0.35 - 4.50 uIU/mL  T3, free  Result Value Ref Range   T3, Free 3.6 2.3 - 4.2 pg/mL   Message to pt Blood counts look ok Metabolic profile is normal A1c is normal, no sign of diabetes Cholesterol overall favorable Your T3 level is normal, but TSH is just slightly suppressed.  This may mean your thyroid is minimally overactive again.  As  your results are so close to normal, I would suggest rechecking in a month or so before we make any other moves.  I will order repeat TSH for you, you are also certainly welcome to follow-up with endocrinology if you like

## 2020-03-07 ENCOUNTER — Other Ambulatory Visit: Payer: Self-pay

## 2020-03-07 ENCOUNTER — Encounter: Payer: Self-pay | Admitting: Family Medicine

## 2020-03-07 ENCOUNTER — Ambulatory Visit: Payer: 59 | Admitting: Family Medicine

## 2020-03-07 VITALS — BP 122/80 | HR 85 | Resp 15 | Ht 68.0 in | Wt 168.0 lb

## 2020-03-07 DIAGNOSIS — I10 Essential (primary) hypertension: Secondary | ICD-10-CM | POA: Diagnosis not present

## 2020-03-07 DIAGNOSIS — Z131 Encounter for screening for diabetes mellitus: Secondary | ICD-10-CM

## 2020-03-07 DIAGNOSIS — Z13220 Encounter for screening for lipoid disorders: Secondary | ICD-10-CM

## 2020-03-07 DIAGNOSIS — E0500 Thyrotoxicosis with diffuse goiter without thyrotoxic crisis or storm: Secondary | ICD-10-CM

## 2020-03-07 DIAGNOSIS — Z1211 Encounter for screening for malignant neoplasm of colon: Secondary | ICD-10-CM

## 2020-03-07 DIAGNOSIS — Z130 Encounter for screening for diseases of the blood and blood-forming organs and certain disorders involving the immune mechanism: Secondary | ICD-10-CM

## 2020-03-07 MED ORDER — AMLODIPINE BESYLATE 2.5 MG PO TABS: 2.5000 mg | ORAL_TABLET | Freq: Every day | ORAL | 3 refills | Status: DC

## 2020-03-07 NOTE — Progress Notes (Signed)
Cologuard ordered for patient.

## 2020-03-08 LAB — LIPID PANEL
Cholesterol: 195 mg/dL (ref 0–200)
HDL: 73.7 mg/dL (ref >39.00)
LDL Cholesterol: 103 mg/dL — ABNORMAL HIGH (ref 0–99)
NonHDL: 121.73
Total CHOL/HDL Ratio: 3
Triglycerides: 93 mg/dL (ref 0.0–149.0)
VLDL: 18.6 mg/dL (ref 0.0–40.0)

## 2020-03-08 LAB — COMPREHENSIVE METABOLIC PANEL
ALT: 10 U/L (ref 0–35)
AST: 17 U/L (ref 0–37)
Albumin: 4.4 g/dL (ref 3.5–5.2)
Alkaline Phosphatase: 51 U/L (ref 39–117)
BUN: 10 mg/dL (ref 6–23)
CO2: 25 mEq/L (ref 19–32)
Calcium: 9.2 mg/dL (ref 8.4–10.5)
Chloride: 103 mEq/L (ref 96–112)
Creatinine, Ser: 0.68 mg/dL (ref 0.40–1.20)
GFR: 92.31 mL/min (ref >60.00)
Glucose, Bld: 90 mg/dL (ref 70–99)
Potassium: 4.1 mEq/L (ref 3.5–5.1)
Sodium: 137 mEq/L (ref 135–145)
Total Bilirubin: 0.4 mg/dL (ref 0.2–1.2)
Total Protein: 7.2 g/dL (ref 6.0–8.3)

## 2020-03-08 LAB — T3, FREE: T3, Free: 3.6 pg/mL (ref 2.3–4.2)

## 2020-03-08 LAB — CBC
HCT: 35.1 % — ABNORMAL LOW (ref 36.0–46.0)
Hemoglobin: 12.1 g/dL (ref 12.0–15.0)
MCHC: 34.4 g/dL (ref 30.0–36.0)
MCV: 92.2 fl (ref 78.0–100.0)
Platelets: 257 10*3/uL (ref 150.0–400.0)
RBC: 3.81 Mil/uL — ABNORMAL LOW (ref 3.87–5.11)
RDW: 12.9 % (ref 11.5–15.5)
WBC: 5.6 10*3/uL (ref 4.0–10.5)

## 2020-03-08 LAB — HEMOGLOBIN A1C: Hgb A1c MFr Bld: 5.3 % (ref 4.6–6.5)

## 2020-03-08 LAB — TSH: TSH: 0.32 u[IU]/mL — ABNORMAL LOW (ref 0.35–4.50)

## 2020-03-08 NOTE — Addendum Note (Signed)
Addended by: Lamar Blinks C on: 03/08/2020 01:23 PM   Modules accepted: Orders

## 2020-03-14 ENCOUNTER — Other Ambulatory Visit (HOSPITAL_BASED_OUTPATIENT_CLINIC_OR_DEPARTMENT_OTHER): Payer: Self-pay | Admitting: Obstetrics and Gynecology

## 2020-03-14 ENCOUNTER — Other Ambulatory Visit: Payer: Self-pay | Admitting: Obstetrics and Gynecology

## 2020-03-14 DIAGNOSIS — Z1231 Encounter for screening mammogram for malignant neoplasm of breast: Secondary | ICD-10-CM

## 2020-03-17 LAB — COLOGUARD: Cologuard: NEGATIVE

## 2020-03-24 LAB — COLOGUARD: COLOGUARD: NEGATIVE

## 2020-03-24 LAB — EXTERNAL GENERIC LAB PROCEDURE: COLOGUARD: NEGATIVE

## 2020-03-28 ENCOUNTER — Encounter: Payer: Self-pay | Admitting: Family Medicine

## 2020-04-29 ENCOUNTER — Encounter: Payer: Self-pay | Admitting: Family Medicine

## 2020-05-05 ENCOUNTER — Other Ambulatory Visit: Payer: Self-pay

## 2020-05-05 ENCOUNTER — Ambulatory Visit
Admission: RE | Admit: 2020-05-05 | Discharge: 2020-05-05 | Disposition: A | Discharge status: Home / Self Care | Payer: 59 | Source: Ambulatory Visit | Attending: Obstetrics and Gynecology | Admitting: Obstetrics and Gynecology

## 2020-05-05 DIAGNOSIS — Z1231 Encounter for screening mammogram for malignant neoplasm of breast: Secondary | ICD-10-CM

## 2020-05-11 ENCOUNTER — Ambulatory Visit: Payer: 59 | Admitting: Cardiology

## 2020-06-03 ENCOUNTER — Ambulatory Visit: Payer: 59 | Admitting: Cardiology

## 2020-06-19 ENCOUNTER — Encounter: Payer: Self-pay | Admitting: Family Medicine

## 2020-06-22 ENCOUNTER — Ambulatory Visit: Payer: 59 | Admitting: *Deleted

## 2020-06-22 ENCOUNTER — Other Ambulatory Visit: Payer: Self-pay

## 2020-06-22 DIAGNOSIS — Z23 Encounter for immunization: Secondary | ICD-10-CM

## 2020-06-22 NOTE — Progress Notes (Signed)
Patient here for flu vaccine.   Vaccine given in left deltoid and patient tolerated well.

## 2020-06-28 ENCOUNTER — Other Ambulatory Visit: Payer: Self-pay | Admitting: Obstetrics and Gynecology

## 2020-07-04 NOTE — Progress Notes (Signed)
Spoke with  Minette Brine and patient is reschedulding surgery for  07-18-2020 lm with medical assistant to check on surgery date.

## 2020-07-05 ENCOUNTER — Encounter: Payer: Self-pay | Admitting: Podiatry

## 2020-07-05 ENCOUNTER — Other Ambulatory Visit: Payer: Self-pay

## 2020-07-05 ENCOUNTER — Ambulatory Visit: Payer: 59 | Admitting: Podiatry

## 2020-07-05 ENCOUNTER — Ambulatory Visit (INDEPENDENT_AMBULATORY_CARE_PROVIDER_SITE_OTHER): Payer: 59

## 2020-07-05 DIAGNOSIS — M722 Plantar fascial fibromatosis: Secondary | ICD-10-CM | POA: Diagnosis not present

## 2020-07-05 MED ORDER — TRIAMCINOLONE ACETONIDE 40 MG/ML IJ SUSP
20.0000 mg | Freq: Once | INTRAMUSCULAR | Status: AC
  Administered 2020-07-05: 20 mg

## 2020-07-05 MED ORDER — TRIAMCINOLONE ACETONIDE 40 MG/ML IJ SUSP: 40.0000 mg | Freq: Once | INTRAMUSCULAR | Status: DC

## 2020-07-05 MED ORDER — MELOXICAM 15 MG PO TABS: 15.0000 mg | ORAL_TABLET | Freq: Every day | ORAL | 3 refills | Status: DC

## 2020-07-05 MED ORDER — METHYLPREDNISOLONE 4 MG PO TBPK: ORAL_TABLET | ORAL | 0 refills | Status: DC

## 2020-07-05 NOTE — Progress Notes (Signed)
Subjective:  Patient ID: Whitney Bowman, female    DOB: [birth year],  MRN: [id number] HPI Chief Complaint  Patient presents with  . Foot Pain    Plantar heel right - aching x 1 month, AM pain, started hurting after trip to [place], tried ice and sneakers  . New Patient (Initial Visit)    49 y.o. female presents with the above complaint.   ROS: Denies fever chills nausea vomiting muscle aches pains calf pain back pain chest pain shortness of breath.  Past Medical History:  Diagnosis Date  . History of positive PPD   . Palpitations    Past Surgical History:  Procedure Laterality Date  . HERNIA REPAIR      Current Outpatient Medications:  .  Probiotic Product (PROBIOTIC DAILY PO), Take by mouth., Disp: , Rfl:  .  amLODipine (NORVASC) 2.5 MG tablet, Take 1 tablet (2.5 mg total) by mouth daily., Disp: 90 tablet, Rfl: 3 .  cyclobenzaprine (FLEXERIL) 5 MG tablet, Take 5 mg by mouth as needed (muscle spasms/cramps). , Disp: , Rfl:  .  meloxicam (MOBIC) 15 MG tablet, Take 1 tablet (15 mg total) by mouth daily., Disp: 30 tablet, Rfl: 3 .  methylPREDNISolone (MEDROL DOSEPAK) 4 MG TBPK tablet, 6 day dose pack - take as directed, Disp: 21 tablet, Rfl: 0 .  Multiple Vitamin (MULTIVITAMIN) tablet, Take 1 tablet by mouth daily., Disp: , Rfl:   Allergies  Allergen Reactions  . Cefdinir     REACTION: PROTRACTED DIARRHEA/ COLITIS  . Shellfish Allergy Diarrhea and Nausea Only  . Sudafed [Pseudoephedrine Hcl]     Tachycardia  . Cephalosporins   . Codeine Nausea And Vomiting   Review of Systems Objective:  There were no vitals filed for this visit.  General: Well developed, nourished, in no acute distress, alert and oriented x3   Dermatological: Skin is warm, dry and supple bilateral. Nails x 10 are well maintained; remaining integument appears unremarkable at this time. There are no open sores, no preulcerative lesions, no rash or signs of infection present.  Vascular: Dorsalis Pedis  artery and Posterior Tibial artery pedal pulses are 2/4 bilateral with immedate capillary fill time. Pedal hair growth present. No varicosities and no lower extremity edema present bilateral.   Neruologic: Grossly intact via light touch bilateral. Vibratory intact via tuning fork bilateral. Protective threshold with Semmes Wienstein monofilament intact to all pedal sites bilateral. Patellar and Achilles deep tendon reflexes 2+ bilateral. No Babinski or clonus noted bilateral.   Musculoskeletal: No gross boney pedal deformities bilateral. No pain, crepitus, or limitation noted with foot and ankle range of motion bilateral. Muscular strength 5/5 in all groups tested bilateral. Pain on palpation medial canthal tubercle of the right heel.  Gait: Unassisted, Nonantalgic.    Radiographs:  Radiographs taken today demonstrate an osseously mature individual with a rectus foot type. She has a small retrocalcaneal heel spur and soft tissue increase in density of plantar fashion calcaneal insertion site. There is no thickening of the Achilles.  Assessment & Plan:   Assessment: Plantar fasciitis right foot.  Plan: Discussed etiology pathology conservative surgical therapies this point time we injected her right heel with 20 mg Kenalog 5 mg Marcaine point of maximal tenderness. Start her on a Medrol Dosepak to be followed by meloxicam. Placed in a plantar fascial brace to be followed by a night splint. Discussed appropriate shoe gear stretching exercise ice therapy shoe gear modifications. Follow-up with her in the near future for reevaluation.  Garrel Ridgel, DPM

## 2020-07-05 NOTE — Patient Instructions (Signed)

## 2020-07-07 ENCOUNTER — Other Ambulatory Visit: Payer: Self-pay

## 2020-07-07 ENCOUNTER — Ambulatory Visit: Payer: 59 | Admitting: Cardiology

## 2020-07-07 ENCOUNTER — Encounter: Payer: Self-pay | Admitting: Cardiology

## 2020-07-07 VITALS — BP 126/84 | HR 82 | Ht 68.0 in | Wt 171.8 lb

## 2020-07-07 DIAGNOSIS — Z8249 Family history of ischemic heart disease and other diseases of the circulatory system: Secondary | ICD-10-CM | POA: Diagnosis not present

## 2020-07-07 DIAGNOSIS — I10 Essential (primary) hypertension: Secondary | ICD-10-CM | POA: Diagnosis not present

## 2020-07-07 NOTE — Patient Instructions (Signed)

## 2020-07-07 NOTE — Progress Notes (Signed)
Cardiology Office Note    Date:  07/10/2020   ID:  Whitney Bowman, DOB 1971-01-19, MRN [id number]  PCP:  Darreld Mclean, MD  Cardiologist:  Fransico Him, MD   Chief Complaint  Patient presents with  . Hypertension    History of Present Illness:  Whitney Bowman is a 49 y.o. female with a hx of palpitations and sinus tachycardia who was first evaluated in 2010 and then again in 2015 for CP with normal ETT and echo.  She was referred back again for evaluation of HTN last year.  She was dx with Grave's disease and was placed on Methimazole but subsequently stopped.  She was seen by Endocrinology and was complaining of palpitations and was referred back for evaluastion. She apparently was placed on OCP due to ovarian cysts and then developed elevated BP and her OCP was changed to a POP and then she was seen back by her PCP and BP improved.    Due to a fm hx of ? Aortic dissection in her mom who died in the OR,  the patient was concerned about the BP and saw me last year. She had a negative chest and abdominal CTA showing no evidence of dissection or aneurysm.  2D echo showed normal LVF.  She is here today for followup and is doing well.  She denies any chest pain or pressure, SOB, DOE, PND, orthopnea, LE edema, dizziness, palpitations or syncope. She is compliant with her meds and is tolerating meds with no SE.      Past Medical History:  Diagnosis Date  . History of positive PPD   . Palpitations     Past Surgical History:  Procedure Laterality Date  . HERNIA REPAIR      Current Medications: Current Meds  Medication Sig  . amLODipine (NORVASC) 2.5 MG tablet Take 1 tablet (2.5 mg total) by mouth daily.  . cyclobenzaprine (FLEXERIL) 5 MG tablet Take 5 mg by mouth as needed (muscle spasms/cramps).   . meloxicam (MOBIC) 15 MG tablet Take 1 tablet (15 mg total) by mouth daily.  . methylPREDNISolone (MEDROL DOSEPAK) 4 MG TBPK tablet 6 day dose pack - take as directed  .  Multiple Vitamin (MULTIVITAMIN) tablet Take 1 tablet by mouth daily.  . Probiotic Product (PROBIOTIC DAILY PO) Take by mouth.    Allergies:   Cefdinir, Shellfish allergy, Sudafed [pseudoephedrine hcl], Cephalosporins, and Codeine   Social History   Socioeconomic History  . Marital status: Married    Spouse name: Not on file  . Number of children: Not on file  . Years of education: Not on file  . Highest education level: Not on file  Occupational History  . Not on file  Tobacco Use  . Smoking status: Never Smoker  . Smokeless tobacco: Never Used  Substance and Sexual Activity  . Alcohol use: Yes    Comment: Social   . Drug use: No  . Sexual activity: Not on file  Other Topics Concern  . Not on file  Social History Narrative  . Not on file   Social Determinants of Health   Financial Resource Strain: Not on file  Food Insecurity: Not on file  Transportation Needs: Not on file  Physical Activity: Not on file  Stress: Not on file  Social Connections: Not on file     Family History:  The patient's family history includes Hypertension in her father and mother.   ROS:   Please see the history of present  illness.    ROS All other systems reviewed and are negative.  No flowsheet data found.   PHYSICAL EXAM:   VS:  BP 126/84   Pulse 82   Ht 5' 8"  (1.727 m)   Wt 171 lb 12.8 oz (77.9 kg)   SpO2 100%   BMI 26.12 kg/m     GEN: Well nourished, well developed in no acute distress HEENT: Normal NECK: No JVD; No carotid bruits LYMPHATICS: No lymphadenopathy CARDIAC:RRR, no murmurs, rubs, gallops RESPIRATORY:  Clear to auscultation without rales, wheezing or rhonchi  ABDOMEN: Soft, non-tender, non-distended MUSCULOSKELETAL:  No edema; No deformity  SKIN: Warm and dry NEUROLOGIC:  Alert and oriented x 3 PSYCHIATRIC:  Normal affect    Wt Readings from Last 3 Encounters:  07/07/20 171 lb 12.8 oz (77.9 kg)  03/07/20 168 lb (76.2 kg)  05/07/19 168 lb (76.2 kg)       Studies/Labs Reviewed:   EKG:  EKG is ordered today.  The ekg ordered today demonstrates NSR   2D echo 12/202 IMPRESSIONS   1. Left ventricular ejection fraction, by visual estimation, is 60 to  65%. The left ventricle has normal function. There is no left ventricular  hypertrophy.  2. Left ventricular diastolic parameters are indeterminate.  3. The left ventricle has no regional wall motion abnormalities.  4. Global right ventricle has normal systolic function.The right  ventricular size is normal. No increase in right ventricular wall  thickness.  5. Left atrial size was normal.  6. Right atrial size was normal.  7. The mitral valve is normal in structure. Trivial mitral valve  regurgitation. No evidence of mitral stenosis.  8. The tricuspid valve is normal in structure. Tricuspid valve  regurgitation is mild.  9. The aortic valve is normal in structure. Aortic valve regurgitation is  not visualized. No evidence of aortic valve sclerosis or stenosis.  10. The pulmonic valve was normal in structure. Pulmonic valve  regurgitation is not visualized.  11. Mildly elevated pulmonary artery systolic pressure.  12. The inferior vena cava is normal in size with greater than 50%  respiratory variability, suggesting right atrial pressure of 3 mmHg.   Recent Labs: 03/07/2020: ALT 10; BUN 10; Creatinine, Ser 0.68; Hemoglobin 12.1; Platelets 257.0; Potassium 4.1; Sodium 137; TSH 0.32   Lipid Panel    Component Value Date/Time   CHOL 195 03/07/2020 1431   TRIG 93.0 03/07/2020 1431   HDL 73.70 03/07/2020 1431   CHOLHDL 3 03/07/2020 1431   VLDL 18.6 03/07/2020 1431   LDLCALC 103 (H) 03/07/2020 1431    Additional studies/ records that were reviewed today include:  Chest and abdominal CTA, 2D echo    ASSESSMENT:    1. Essential hypertension   2. Family history of aortic dissection      PLAN:  In order of problems listed above:  1.  Hypertension  -BP is well  controlled on exam today -continue amlodipine 2.87m daily  2.   Family hx of aortic disease  -her mother died of aortic dissection and also had an iliac artery aneurysm and also had HOCM.   -Her moms father had a descending aortic aneurysm and iliac aneurysms.  -screening Chest and abdominal CTA showed no aneurysm.  -she was referred for genetic counseling and it was recommended that echo and CT be done and if normal then no further genetic testing would be recommended  -I have recommended repeating 2D echo in 2 years and Chest CTA in 2-3 years -she will  see me back yearly to make sure her BP is adequately controlled    Medication Adjustments/Labs and Tests Ordered: Current medicines are reviewed at length with the patient today.  Concerns regarding medicines are outlined above.  Medication changes, Labs and Tests ordered today are listed in the Patient Instructions below.  Patient Instructions  Medication Instructions:  Your physician recommends that you continue on your current medications as directed. Please refer to the Current Medication list given to you today.  *If you need a refill on your cardiac medications before your next appointment, please call your pharmacy*  Follow-Up: At [facility], you and your health needs are our priority.  As part of our continuing mission to provide you with exceptional heart care, we have created designated Provider Care Teams.  These Care Teams include your primary Cardiologist (physician) and Advanced Practice Providers (APPs -  Physician Assistants and Nurse Practitioners) who all work together to provide you with the care you need, when you need it.   Your next appointment:   1 year(s)  The format for your next appointment:   In Person  Provider:   You may see Fransico Him, MD or one of the following Advanced Practice Providers on your designated Care Team:    Melina Copa, PA-C  Ermalinda Barrios, PA-C       Signed, Fransico Him,  MD  07/10/2020 2:46 PM    [address] [place], [place], [place]  [address] Phone: [phone]; Fax: [phone]

## 2020-07-08 ENCOUNTER — Other Ambulatory Visit: Payer: Self-pay | Admitting: Obstetrics and Gynecology

## 2020-07-15 ENCOUNTER — Other Ambulatory Visit: Payer: Self-pay

## 2020-07-15 ENCOUNTER — Encounter (HOSPITAL_COMMUNITY)
Admission: RE | Admit: 2020-07-15 | Discharge: 2020-07-15 | Disposition: A | Discharge status: Home / Self Care | Payer: 59 | Source: Ambulatory Visit | Attending: Obstetrics and Gynecology | Admitting: Obstetrics and Gynecology

## 2020-07-15 DIAGNOSIS — Z01812 Encounter for preprocedural laboratory examination: Secondary | ICD-10-CM | POA: Insufficient documentation

## 2020-07-15 LAB — CBC WITH DIFFERENTIAL/PLATELET
Abs Immature Granulocytes: 0.02 10*3/uL (ref 0.00–0.07)
Basophils Absolute: 0 10*3/uL (ref 0.0–0.1)
Basophils Relative: 0 %
Eosinophils Absolute: 0.1 10*3/uL (ref 0.0–0.5)
Eosinophils Relative: 2 %
HCT: 41.2 % (ref 36.0–46.0)
Hemoglobin: 13.6 g/dL (ref 12.0–15.0)
Immature Granulocytes: 0 %
Lymphocytes Relative: 22 %
Lymphs Abs: 2 10*3/uL (ref 0.7–4.0)
MCH: 31 pg (ref 26.0–34.0)
MCHC: 33 g/dL (ref 30.0–36.0)
MCV: 93.8 fL (ref 80.0–100.0)
Monocytes Absolute: 0.5 10*3/uL (ref 0.1–1.0)
Monocytes Relative: 6 %
Neutro Abs: 6.4 10*3/uL (ref 1.7–7.7)
Neutrophils Relative %: 70 %
Platelets: 283 10*3/uL (ref 150–400)
RBC: 4.39 MIL/uL (ref 3.87–5.11)
RDW: 12.7 % (ref 11.5–15.5)
WBC: 9.2 10*3/uL (ref 4.0–10.5)
nRBC: 0 % (ref 0.0–0.2)

## 2020-07-15 LAB — BASIC METABOLIC PANEL
Anion gap: 11 (ref 5–15)
BUN: 15 mg/dL (ref 6–20)
CO2: 24 mmol/L (ref 22–32)
Calcium: 9.5 mg/dL (ref 8.9–10.3)
Chloride: 99 mmol/L (ref 98–111)
Creatinine, Ser: 0.73 mg/dL (ref 0.44–1.00)
GFR, Estimated: >60 mL/min (ref >60)
Glucose, Bld: 100 mg/dL — ABNORMAL HIGH (ref 70–99)
Potassium: 4.3 mmol/L (ref 3.5–5.1)
Sodium: 134 mmol/L — ABNORMAL LOW (ref 135–145)

## 2020-07-16 ENCOUNTER — Other Ambulatory Visit (HOSPITAL_COMMUNITY)
Admission: RE | Admit: 2020-07-16 | Discharge: 2020-07-16 | Disposition: A | Discharge status: Home / Self Care | Payer: 59 | Source: Ambulatory Visit | Attending: Obstetrics and Gynecology | Admitting: Obstetrics and Gynecology

## 2020-07-16 DIAGNOSIS — Z20822 Contact with and (suspected) exposure to covid-19: Secondary | ICD-10-CM | POA: Diagnosis not present

## 2020-07-16 DIAGNOSIS — Z01812 Encounter for preprocedural laboratory examination: Secondary | ICD-10-CM | POA: Diagnosis not present

## 2020-07-16 LAB — SARS CORONAVIRUS 2 (TAT 6-24 HRS): SARS Coronavirus 2: NEGATIVE

## 2020-07-18 ENCOUNTER — Other Ambulatory Visit: Payer: Self-pay

## 2020-07-18 ENCOUNTER — Encounter (HOSPITAL_BASED_OUTPATIENT_CLINIC_OR_DEPARTMENT_OTHER): Payer: Self-pay | Admitting: Obstetrics and Gynecology

## 2020-07-18 NOTE — Progress Notes (Signed)
Spoke w/ via phone for pre-op interview--- PT Lab needs dos---- Urine preg           Lab results------ pt had CBCdiff / BMP done 07-15-2020 results in epic;  Current ekg in epic/ chart COVID test ------ done 07-16-2020 negative results in epic Arrive at ------- 0530 NPO after MN  Medications to take morning of surgery ----- Norvasc Diabetic medication ----- n/a Patient Special Instructions ----- n/a Pre-Op special Istructions ----- n/a Patient verbalized understanding of instructions that were given at this phone interview. Patient denies shortness of breath, chest pain, fever, cough at this phone interview.

## 2020-07-18 NOTE — H&P (Signed)
Whitney Bowman is an 49 y.o. female G2P2 MF here for dx hysteroscopy, D&C, endometrial ablation due to menometrorrhagia. EBX benign. Failed hormonal therapy. Nl sonogram  Pertinent Gynecological History: Menses: flow is excessive with use of 5 pads or tampons on heaviest days Bleeding: intermenstrual bleeding Contraception: none DES exposure: denies Blood transfusions: none Sexually transmitted diseases: no past history Previous GYN Procedures: none  Last mammogram: normal Date: 2021 Last pap: normal Date: 2021 OB History: G2P2   Menstrual History: Menarche age: n/a No LMP recorded. (Menstrual status: Irregular Periods).    Past Medical History:  Diagnosis Date  . History of positive PPD   . Palpitations     Past Surgical History:  Procedure Laterality Date  . HERNIA REPAIR      Family History  Problem Relation Age of Onset  . Hypertension Mother   . Hypertension Father     Social History:  reports that she has never smoked. She has never used smokeless tobacco. She reports current alcohol use. She reports that she does not use drugs.  Allergies:  Allergies  Allergen Reactions  . Cefdinir     REACTION: PROTRACTED DIARRHEA/ COLITIS  . Shellfish Allergy Diarrhea and Nausea Only  . Sudafed [Pseudoephedrine Hcl]     Tachycardia  . Cephalosporins   . Codeine Nausea And Vomiting    No medications prior to admission.    Review of Systems  All other systems reviewed and are negative.   There were no vitals taken for this visit. Physical Exam Constitutional:      Appearance: Normal appearance.  Eyes:     Extraocular Movements: Extraocular movements intact.  Cardiovascular:     Rate and Rhythm: Regular rhythm.     Heart sounds: Normal heart sounds.  Abdominal:     Palpations: Abdomen is soft.  Genitourinary:    General: Normal vulva.     Comments: Vagina; no lesion Cervix parous Uterus AV nl Adnexa nl Musculoskeletal:        General: Normal range of  motion.     Cervical back: Neck supple.  Neurological:     Mental Status: She is alert and oriented to person, place, and time.  Psychiatric:        Mood and Affect: Mood normal.     No results found for this or any previous visit (from the past 24 hour(s)).  No results found.  Assessment/Plan: Menometrorrhagia P) dx hysteroscopy, D&C, endometrial ablation. Procedure explained. Risk of surgery reviewed including infection, bleeding, injury to surrounding organ structures, uterine perforation ( 07/998) and its risk, thermal injury, fluid overload and its mgmt. 90% reduction in flow. All ? answered  Jarrad Mclees A Preslyn Warr 07/18/2020, 6:06 AM

## 2020-07-19 ENCOUNTER — Encounter (HOSPITAL_BASED_OUTPATIENT_CLINIC_OR_DEPARTMENT_OTHER)
Admission: RE | Disposition: A | Discharge status: Home / Self Care | Payer: Self-pay | Source: Home / Self Care | Attending: Obstetrics and Gynecology

## 2020-07-19 ENCOUNTER — Ambulatory Visit (HOSPITAL_BASED_OUTPATIENT_CLINIC_OR_DEPARTMENT_OTHER): Payer: 59 | Admitting: Anesthesiology

## 2020-07-19 ENCOUNTER — Other Ambulatory Visit: Payer: Self-pay

## 2020-07-19 ENCOUNTER — Encounter (HOSPITAL_BASED_OUTPATIENT_CLINIC_OR_DEPARTMENT_OTHER): Payer: Self-pay | Admitting: Obstetrics and Gynecology

## 2020-07-19 ENCOUNTER — Ambulatory Visit (HOSPITAL_BASED_OUTPATIENT_CLINIC_OR_DEPARTMENT_OTHER)
Admission: RE | Admit: 2020-07-19 | Discharge: 2020-07-19 | Disposition: A | Discharge status: Home / Self Care | Payer: 59 | Attending: Obstetrics and Gynecology | Admitting: Obstetrics and Gynecology

## 2020-07-19 DIAGNOSIS — Z881 Allergy status to other antibiotic agents status: Secondary | ICD-10-CM | POA: Diagnosis not present

## 2020-07-19 DIAGNOSIS — Z885 Allergy status to narcotic agent status: Secondary | ICD-10-CM | POA: Insufficient documentation

## 2020-07-19 DIAGNOSIS — R9389 Abnormal findings on diagnostic imaging of other specified body structures: Secondary | ICD-10-CM | POA: Insufficient documentation

## 2020-07-19 DIAGNOSIS — Q51818 Other congenital malformations of uterus: Secondary | ICD-10-CM | POA: Diagnosis not present

## 2020-07-19 DIAGNOSIS — Z91013 Allergy to seafood: Secondary | ICD-10-CM | POA: Insufficient documentation

## 2020-07-19 DIAGNOSIS — Z888 Allergy status to other drugs, medicaments and biological substances status: Secondary | ICD-10-CM | POA: Diagnosis not present

## 2020-07-19 DIAGNOSIS — Z8249 Family history of ischemic heart disease and other diseases of the circulatory system: Secondary | ICD-10-CM | POA: Insufficient documentation

## 2020-07-19 DIAGNOSIS — N921 Excessive and frequent menstruation with irregular cycle: Secondary | ICD-10-CM | POA: Insufficient documentation

## 2020-07-19 DIAGNOSIS — N938 Other specified abnormal uterine and vaginal bleeding: Secondary | ICD-10-CM | POA: Diagnosis not present

## 2020-07-19 HISTORY — DX: Personal history of traumatic brain injury: Z87.820

## 2020-07-19 HISTORY — PX: DILATATION & CURRETTAGE/HYSTEROSCOPY WITH RESECTOCOPE: SHX5572

## 2020-07-19 HISTORY — DX: Essential (primary) hypertension: I10

## 2020-07-19 HISTORY — DX: Other specified abnormal uterine and vaginal bleeding: N93.8

## 2020-07-19 HISTORY — DX: Personal history of other endocrine, nutritional and metabolic disease: Z86.39

## 2020-07-19 HISTORY — PX: HYSTEROSCOPY WITH RESECTOSCOPE: SHX5395

## 2020-07-19 HISTORY — DX: Presence of spectacles and contact lenses: Z97.3

## 2020-07-19 HISTORY — DX: Palpitations: R00.2

## 2020-07-19 LAB — POCT PREGNANCY, URINE: Preg Test, Ur: NEGATIVE

## 2020-07-19 SURGERY — DILATATION & CURETTAGE/HYSTEROSCOPY WITH RESECTOCOPE
Anesthesia: General

## 2020-07-19 MED ORDER — FENTANYL CITRATE (PF) 100 MCG/2ML IJ SOLN
INTRAMUSCULAR | Status: AC
  Filled 2020-07-19: qty 2

## 2020-07-19 MED ORDER — MEPERIDINE HCL 25 MG/ML IJ SOLN: 6.2500 mg | INTRAMUSCULAR | Status: DC | PRN

## 2020-07-19 MED ORDER — OXYCODONE HCL 5 MG/5ML PO SOLN: 5.0000 mg | Freq: Once | ORAL | Status: AC | PRN

## 2020-07-19 MED ORDER — FENTANYL CITRATE (PF) 100 MCG/2ML IJ SOLN
25.0000 ug | INTRAMUSCULAR | Status: DC | PRN
  Administered 2020-07-19 (×2): 50 ug via INTRAVENOUS

## 2020-07-19 MED ORDER — OXYCODONE-ACETAMINOPHEN 5-325 MG PO TABS: 1.0000 | ORAL_TABLET | ORAL | 0 refills | Status: AC | PRN

## 2020-07-19 MED ORDER — KETOROLAC TROMETHAMINE 30 MG/ML IJ SOLN
INTRAMUSCULAR | Status: DC | PRN
  Administered 2020-07-19: 30 mg via INTRAVENOUS

## 2020-07-19 MED ORDER — SODIUM CHLORIDE 0.9 % IR SOLN
Status: DC | PRN
  Administered 2020-07-19: 3000 mL

## 2020-07-19 MED ORDER — PROMETHAZINE HCL 25 MG/ML IJ SOLN
INTRAMUSCULAR | Status: AC
  Filled 2020-07-19: qty 1

## 2020-07-19 MED ORDER — PROPOFOL 10 MG/ML IV BOLUS
INTRAVENOUS | Status: DC | PRN
  Administered 2020-07-19: 150 mg via INTRAVENOUS

## 2020-07-19 MED ORDER — DEXAMETHASONE SODIUM PHOSPHATE 10 MG/ML IJ SOLN
INTRAMUSCULAR | Status: AC
  Filled 2020-07-19: qty 1

## 2020-07-19 MED ORDER — ONDANSETRON HCL 4 MG/2ML IJ SOLN
INTRAMUSCULAR | Status: AC
  Filled 2020-07-19: qty 2

## 2020-07-19 MED ORDER — ARTIFICIAL TEARS OPHTHALMIC OINT
TOPICAL_OINTMENT | OPHTHALMIC | Status: AC
  Filled 2020-07-19: qty 3.5

## 2020-07-19 MED ORDER — LACTATED RINGERS IV SOLN: INTRAVENOUS | Status: DC

## 2020-07-19 MED ORDER — PROMETHAZINE HCL 25 MG/ML IJ SOLN
6.2500 mg | Freq: Once | INTRAMUSCULAR | Status: AC
  Administered 2020-07-19: 09:00:00 6.25 mg via INTRAVENOUS

## 2020-07-19 MED ORDER — OXYCODONE HCL 5 MG PO TABS
ORAL_TABLET | ORAL | Status: AC
  Filled 2020-07-19: qty 1

## 2020-07-19 MED ORDER — AMISULPRIDE (ANTIEMETIC) 5 MG/2ML IV SOLN: 10.0000 mg | Freq: Once | INTRAVENOUS | Status: DC | PRN

## 2020-07-19 MED ORDER — IBUPROFEN 800 MG PO TABS: 800.0000 mg | ORAL_TABLET | Freq: Three times a day (TID) | ORAL | 9 refills | Status: DC | PRN

## 2020-07-19 MED ORDER — DEXMEDETOMIDINE (PRECEDEX) IN NS 20 MCG/5ML (4 MCG/ML) IV SYRINGE
PREFILLED_SYRINGE | INTRAVENOUS | Status: AC
  Filled 2020-07-19: qty 5

## 2020-07-19 MED ORDER — LIDOCAINE 2% (20 MG/ML) 5 ML SYRINGE
INTRAMUSCULAR | Status: DC | PRN
  Administered 2020-07-19: 50 mg via INTRAVENOUS

## 2020-07-19 MED ORDER — ACETAMINOPHEN 160 MG/5ML PO SOLN: 325.0000 mg | Freq: Once | ORAL | Status: DC | PRN

## 2020-07-19 MED ORDER — KETOROLAC TROMETHAMINE 30 MG/ML IJ SOLN
INTRAMUSCULAR | Status: AC
  Filled 2020-07-19: qty 1

## 2020-07-19 MED ORDER — ACETAMINOPHEN 325 MG PO TABS: 325.0000 mg | ORAL_TABLET | Freq: Once | ORAL | Status: DC | PRN

## 2020-07-19 MED ORDER — SCOPOLAMINE 1 MG/3DAYS TD PT72
MEDICATED_PATCH | TRANSDERMAL | Status: AC
  Filled 2020-07-19: qty 1

## 2020-07-19 MED ORDER — PROPOFOL 10 MG/ML IV BOLUS
INTRAVENOUS | Status: AC
  Filled 2020-07-19: qty 40

## 2020-07-19 MED ORDER — SCOPOLAMINE 1 MG/3DAYS TD PT72
MEDICATED_PATCH | TRANSDERMAL | Status: DC | PRN
  Administered 2020-07-19: 1 via TRANSDERMAL

## 2020-07-19 MED ORDER — OXYCODONE HCL 5 MG PO TABS
5.0000 mg | ORAL_TABLET | Freq: Once | ORAL | Status: AC | PRN
  Administered 2020-07-19: 5 mg via ORAL

## 2020-07-19 MED ORDER — DEXAMETHASONE SODIUM PHOSPHATE 10 MG/ML IJ SOLN
INTRAMUSCULAR | Status: DC | PRN
  Administered 2020-07-19: 10 mg via INTRAVENOUS

## 2020-07-19 MED ORDER — ONDANSETRON HCL 4 MG/2ML IJ SOLN
INTRAMUSCULAR | Status: DC | PRN
  Administered 2020-07-19: 4 mg via INTRAVENOUS

## 2020-07-19 MED ORDER — FENTANYL CITRATE (PF) 100 MCG/2ML IJ SOLN
INTRAMUSCULAR | Status: DC | PRN
  Administered 2020-07-19: 25 ug via INTRAVENOUS
  Administered 2020-07-19: 50 ug via INTRAVENOUS
  Administered 2020-07-19: 25 ug via INTRAVENOUS

## 2020-07-19 MED ORDER — ACETAMINOPHEN 10 MG/ML IV SOLN: 1000.0000 mg | Freq: Once | INTRAVENOUS | Status: DC | PRN

## 2020-07-19 MED ORDER — MIDAZOLAM HCL 2 MG/2ML IJ SOLN
INTRAMUSCULAR | Status: DC | PRN
  Administered 2020-07-19: 2 mg via INTRAVENOUS

## 2020-07-19 MED ORDER — LIDOCAINE HCL (PF) 2 % IJ SOLN
INTRAMUSCULAR | Status: AC
  Filled 2020-07-19: qty 5

## 2020-07-19 MED ORDER — MIDAZOLAM HCL 2 MG/2ML IJ SOLN
INTRAMUSCULAR | Status: AC
  Filled 2020-07-19: qty 2

## 2020-07-19 SURGICAL SUPPLY — 21 items
ABLATOR SURESOUND NOVASURE (ABLATOR) ×2 IMPLANT
BIPOLAR CUTTING LOOP 21FR (ELECTRODE)
CANISTER SUCT 3000ML PPV (MISCELLANEOUS) ×3 IMPLANT
CATH ROBINSON RED A/P 16FR (CATHETERS) IMPLANT
COVER WAND RF STERILE (DRAPES) ×3 IMPLANT
DILATOR CANAL MILEX (MISCELLANEOUS) IMPLANT
GAUZE 4X4 16PLY RFD (DISPOSABLE) ×3 IMPLANT
GLOVE BIOGEL PI IND STRL 7.0 (GLOVE) ×1 IMPLANT
GLOVE BIOGEL PI INDICATOR 7.0 (GLOVE) ×2
GLOVE ECLIPSE 6.5 STRL STRAW (GLOVE) ×3 IMPLANT
GOWN STRL REUS W/TWL LRG LVL3 (GOWN DISPOSABLE) ×3 IMPLANT
IV NS IRRIG 3000ML ARTHROMATIC (IV SOLUTION) ×4 IMPLANT
KIT PROCEDURE FLUENT (KITS) ×2 IMPLANT
KIT TURNOVER CYSTO (KITS) ×3 IMPLANT
LOOP CUTTING BIPOLAR 21FR (ELECTRODE) IMPLANT
PACK VAGINAL MINOR WOMEN LF (CUSTOM PROCEDURE TRAY) ×3 IMPLANT
PAD OB MATERNITY 4.3X12.25 (PERSONAL CARE ITEMS) ×3 IMPLANT
PAD PREP 24X48 CUFFED NSTRL (MISCELLANEOUS) ×3 IMPLANT
SET GENESYS HTA PROCERVA (MISCELLANEOUS) ×2 IMPLANT
TOWEL OR 17X26 10 PK STRL BLUE (TOWEL DISPOSABLE) ×6 IMPLANT
WATER STERILE IRR 500ML POUR (IV SOLUTION) ×3 IMPLANT

## 2020-07-19 NOTE — Anesthesia Preprocedure Evaluation (Addendum)
Anesthesia Evaluation  Patient identified by MRN, date of birth, ID band Patient awake    Reviewed: Allergy & Precautions, NPO status , Patient's Chart, lab work & pertinent test results  Airway Mallampati: II  TM Distance: >3 FB Neck ROM: Full    Dental  (+) Teeth Intact, Dental Advisory Given   Pulmonary    breath sounds clear to auscultation       Cardiovascular hypertension,  Rhythm:Regular Rate:Normal     Neuro/Psych negative neurological ROS  negative psych ROS   GI/Hepatic negative GI ROS, Neg liver ROS,   Endo/Other  negative endocrine ROS  Renal/GU negative Renal ROS     Musculoskeletal  (+) Arthritis ,   Abdominal Normal abdominal exam  (+)   Peds  Hematology negative hematology ROS (+)   Anesthesia Other Findings   Reproductive/Obstetrics                            Anesthesia Physical Anesthesia Plan  ASA: II  Anesthesia Plan: General   Post-op Pain Management:    Induction: Intravenous  PONV Risk Score and Plan: 4 or greater and Ondansetron, Dexamethasone, Midazolam and Scopolamine patch - Pre-op  Airway Management Planned: LMA  Additional Equipment: None  Intra-op Plan:   Post-operative Plan: Extubation in OR  Informed Consent: I have reviewed the patients History and Physical, chart, labs and discussed the procedure including the risks, benefits and alternatives for the proposed anesthesia with the patient or authorized representative who has indicated his/her understanding and acceptance.       Plan Discussed with: CRNA  Anesthesia Plan Comments: (Lab Results      Component                Value               Date                      WBC                      9.2                 07/15/2020                HGB                      13.6                07/15/2020                HCT                      41.2                07/15/2020                MCV                       93.8                07/15/2020                PLT                      283  07/15/2020            Echo: 1. Left ventricular ejection fraction, by visual estimation, is 60 to  65%. The left ventricle has normal function. There is no left ventricular  hypertrophy.  2. Left ventricular diastolic parameters are indeterminate.  3. The left ventricle has no regional wall motion abnormalities.  4. Global right ventricle has normal systolic function.The right  ventricular size is normal. No increase in right ventricular wall  thickness.  5. Left atrial size was normal.  6. Right atrial size was normal.  7. The mitral valve is normal in structure. Trivial mitral valve  regurgitation. No evidence of mitral stenosis.  8. The tricuspid valve is normal in structure. Tricuspid valve  regurgitation is mild.  9. The aortic valve is normal in structure. Aortic valve regurgitation is  not visualized. No evidence of aortic valve sclerosis or stenosis.  10. The pulmonic valve was normal in structure. Pulmonic valve  regurgitation is not visualized.  11. Mildly elevated pulmonary artery systolic pressure.  12. The inferior vena cava is normal in size with greater than 50%  respiratory variability, suggesting right atrial pressure of 3 mmHg. )       Anesthesia Quick Evaluation

## 2020-07-19 NOTE — Brief Op Note (Signed)
07/19/2020  8:34 AM  PATIENT:  Whitney Bowman  49 y.o. female  PRE-OPERATIVE DIAGNOSIS:  menometrorrhagia  POST-OPERATIVE DIAGNOSIS:  menometrorrhagia  PROCEDURE:  diagnostic hysteroscopy, dilation and curettage, HTA endometrial ablation  SURGEON:  Surgeon(s) and Role:    * Servando Salina, MD - Primary  PHYSICIAN ASSISTANT:   ASSISTANTS: none   ANESTHESIA:   general FINDINGS;  septated uterus . No polyps noted. Tubal ostia seen in each horn EBL:  5 mL   BLOOD ADMINISTERED:none  DRAINS: none   LOCAL MEDICATIONS USED:  NONE  SPECIMEN:  Source of Specimen:  [facility]  DISPOSITION OF SPECIMEN:  PATHOLOGY  COUNTS:  YES  TOURNIQUET:  * No tourniquets in log *  DICTATION: .641583  PLAN OF CARE: Discharge to home after PACU  PATIENT DISPOSITION:  PACU - hemodynamically stable.   Delay start of Pharmacological VTE agent (>24hrs) due to surgical blood loss or risk of bleeding: no

## 2020-07-19 NOTE — Interval H&P Note (Signed)
History and Physical Interval Note:  07/19/2020 7:26 AM  Abigail Miyamoto  has presented today for surgery, with the diagnosis of DUB.  The various methods of treatment have been discussed with the patient and family. After consideration of risks, benefits and other options for treatment, the patient has consented to  Procedure(s): DILATATION & CURETTAGE/HYSTEROSCOPY WITH RESECTOCOPE (N/A) HYSTEROSCOPY WITH RESECTOSCOPE (N/A) as a surgical intervention.  The patient's history has been reviewed, patient examined, no change in status, stable for surgery.  I have reviewed the patient's chart and labs.  Questions were answered to the patient's satisfaction.     Jenene Kauffmann A Thanh Pomerleau

## 2020-07-19 NOTE — Discharge Instructions (Signed)
°  Post Anesthesia Home Care Instructions  Activity: Get plenty of rest for the remainder of the day. A responsible individual must stay with you for 24 hours following the procedure.  For the next 24 hours, DO NOT: -Drive a car -Paediatric nurse -Drink alcoholic beverages -Take any medication unless instructed by your physician -Make any legal decisions or sign important papers.  Meals: Start with liquid foods such as gelatin or soup. Progress to regular foods as tolerated. Avoid greasy, spicy, heavy foods. If nausea and/or vomiting occur, drink only clear liquids until the nausea and/or vomiting subsides. Call your physician if vomiting continues.  Special Instructions/Symptoms: Your throat may feel dry or sore from the anesthesia or the breathing tube placed in your throat during surgery. If this causes discomfort, gargle with warm salt water. The discomfort should disappear within 24 hours.  If you had a scopolamine patch placed behind your ear for the management of post- operative nausea and/or vomiting:  1. The medication in the patch is effective for 72 hours, after which it should be removed.  Wrap patch in a tissue and discard in the trash. Wash hands thoroughly with soap and water. 2. You may remove the patch earlier than 72 hours if you experience unpleasant side effects which may include dry mouth, dizziness or visual disturbances. 3. Avoid touching the patch. Wash your hands with soap and water after contact with the patch.    Remove patch behind right ear by Friday, July 22, 2020.  DISCHARGE INSTRUCTIONS: D&C  The following instructions have been prepared to help you care for yourself upon your return home.   Personal hygiene:  Use sanitary pads for vaginal drainage, not tampons.  Shower the day after your procedure.  NO tub baths, pools or Jacuzzis for 2-3 weeks.  Wipe front to back after using the bathroom.  Activity and limitations:  Do NOT drive or  operate any equipment for 24 hours. The effects of anesthesia are still present and drowsiness may result.  Do NOT rest in bed all day.  Walking is encouraged.  Walk up and down stairs slowly.  You may resume your normal activity in one to two days or as indicated by your physician.  Sexual activity: NO intercourse for at least 2 weeks after the procedure, or as indicated by your physician.  Diet: Eat a light meal as desired this evening. You may resume your usual diet tomorrow.  Return to work: You may resume your work activities in one to two days or as indicated by your doctor.  What to expect after your surgery: Expect to have vaginal bleeding/discharge for 2-3 days and spotting for up to 10 days. It is not unusual to have soreness for up to 1-2 weeks. You may have a slight burning sensation when you urinate for the first day. Mild cramps may continue for a couple of days. You may have a regular period in 2-6 weeks.  Call your doctor for any of the following:  Excessive vaginal bleeding, saturating and changing one pad every hour.  Inability to urinate 6 hours after discharge from hospital.  Pain not relieved by pain medication.  Fever of 100.4 F or greater.  Unusual vaginal discharge or odor.

## 2020-07-19 NOTE — Anesthesia Procedure Notes (Signed)
Procedure Name: LMA Insertion Performed by: Mechele Claude, CRNA Pre-anesthesia Checklist: Patient identified, Emergency Drugs available, Suction available and Patient being monitored Patient Re-evaluated:Patient Re-evaluated prior to induction Oxygen Delivery Method: Circle system utilized Preoxygenation: Pre-oxygenation with 100% oxygen Induction Type: IV induction Ventilation: Mask ventilation without difficulty LMA: LMA inserted LMA Size: 4.0 Number of attempts: 1 Airway Equipment and Method: Bite block Placement Confirmation: positive ETCO2 Tube secured with: Tape Dental Injury: Teeth and Oropharynx as per pre-operative assessment

## 2020-07-19 NOTE — Anesthesia Postprocedure Evaluation (Signed)
Anesthesia Post Note  Patient: Whitney Bowman  Procedure(s) Performed: DILATATION & CURETTAGE/HYSTEROSCOPY WITH RESECTOCOPE (N/A ) HYSTEROSCOPY WITH RESECTOSCOPE (N/A )     Patient location during evaluation: PACU Anesthesia Type: General Level of consciousness: awake and alert Pain management: pain level controlled Vital Signs Assessment: post-procedure vital signs reviewed and stable Respiratory status: spontaneous breathing, nonlabored ventilation, respiratory function stable and patient connected to nasal cannula oxygen Cardiovascular status: blood pressure returned to baseline and stable Postop Assessment: no apparent nausea or vomiting Anesthetic complications: no   No complications documented.  Last Vitals:  Vitals:   07/19/20 0930 07/19/20 1024  BP: 113/78 130/72  Pulse: 71 88  Resp: 12 18  Temp:  36.6 C  SpO2: 96% 100%    Last Pain:  Vitals:   07/19/20 1024  TempSrc:   PainSc: [name]

## 2020-07-19 NOTE — Transfer of Care (Signed)
Immediate Anesthesia Transfer of Care Note  Patient: Whitney Bowman  Procedure(s) Performed: Procedure(s) (LRB): DILATATION & CURETTAGE/HYSTEROSCOPY WITH RESECTOCOPE (N/A) HYSTEROSCOPY WITH RESECTOSCOPE (N/A)  Patient Location: PACU  Anesthesia Type: General  Level of Consciousness: awake, alert  and oriented  Airway & Oxygen Therapy: Patient Spontanous Breathing and Patient connected to face mask oxygen  Post-op Assessment: Report given to PACU RN and Post -op Vital signs reviewed and stable  Post vital signs: Reviewed and stable  Complications: No apparent anesthesia complicationsLast Vitals:  Vitals Value Taken Time  BP 136/86 07/19/20 0843  Temp    Pulse 85 07/19/20 0844  Resp 17 07/19/20 0844  SpO2 100 % 07/19/20 0844  Vitals shown include unvalidated device data.  Last Pain:  Vitals:   07/19/20 0540  TempSrc: Oral         Complications: No complications documented.

## 2020-07-20 ENCOUNTER — Encounter (HOSPITAL_BASED_OUTPATIENT_CLINIC_OR_DEPARTMENT_OTHER): Payer: Self-pay | Admitting: Obstetrics and Gynecology

## 2020-07-20 LAB — SURGICAL PATHOLOGY

## 2020-07-20 NOTE — Op Note (Signed)
NAME: Whitney Bowman, Whitney Bowman MEDICAL RECORD [id number] ACCOUNT [id number] DATE OF BIRTH:1970-11-30 FACILITY: [facility] LOCATION: [facility] PHYSICIAN:Bryelle Spiewak A. Derris Millan, MD  OPERATIVE REPORT  DATE OF PROCEDURE:  07/19/2020  PREOPERATIVE DIAGNOSIS:  Menometrorrhagia.  PROCEDURE PERFORMED:  Diagnostic hysteroscopy, dilation and curettage, HTA ablation.  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  ANESTHESIA:  General.  SURGEON:  Servando Salina, MD  ASSISTANT:  None.  DESCRIPTION OF PROCEDURE:  Under adequate general anesthesia, the patient was placed in the dorsal lithotomy position.  She was sterilely prepped and draped in the usual fashion.  Bladder catheterized a moderate amount of urine.  Examination under  anesthesia revealed an anteflexed bulky uterus.  No adnexal masses could be appreciated.  Bivalve speculum was placed in the vagina.  Single tooth tenaculum was placed on the anterior lip of the cervix.  The uterus sounded to 9 cm.  The endocervical  canal sounded to 3 cm.  The cervix was then dilated and a diagnostic hysteroscope was introduced into the uterine cavity.  Of note, endometrial thickening irregularly was noted.  No discrete polyp was noted; however, there was clearly a distinct Y-shape  of the upper part of the uterus with the identification of both tubal ostias in each horn.  Given those findings, a decision was made not to use a NovaSure apparatus and instead use HTA for the endometrial ablation. The hysteroscope was withdrawn. Endometrial curetting was done for moderate amount of tissue sent to Pathology. The cervix was then further dilated up using #23 Pratt dilator and a #8 Hegar.  The HTA apparatus was inserted and the procedure was followed with the warming of the cavity followed by 10 minutes of ablation and 1.5 minute cooling.  The cavity was  inspected.  Good ablation was noted throughout.  The procedure was terminated by removing  all instruments from the vagina.    SPECIMENS:  Endometrial curettings sent to pathology.  ESTIMATED BLOOD LOSS:  Minimal.  FLUID DEFICIT:  115 mL.  COMPLICATIONS:  None.  DISPOSITION:  The patient tolerated the procedure well and was transferred to recovery room in stable condition.  HN/NUANCE  D:07/19/2020 T:07/20/2020 JOB:[id number]

## 2020-08-04 ENCOUNTER — Other Ambulatory Visit: Payer: Self-pay

## 2020-08-04 ENCOUNTER — Ambulatory Visit: Payer: 59 | Admitting: Podiatry

## 2020-08-04 DIAGNOSIS — M722 Plantar fascial fibromatosis: Secondary | ICD-10-CM | POA: Diagnosis not present

## 2020-08-04 NOTE — Progress Notes (Signed)
Whitney Bowman presents today for follow-up of her plantar fasciitis states that she is doing approximately 70% better for the right foot.  Continues to wear her plantar fascial brace and her night splint as well as her good tennis shoes.  She also was unable to start her meloxicam until just about a week or so ago because of surgery that she was having.  Objective: Vital signs are stable she is alert oriented x3 she no longer has pain on palpation medial calcaneal tubercle central calcaneal tubercle and central band at its insertion site is the most painful.  No lateral pain whatsoever.  Assessment: Chronic intractable plantar fasciitis resolving in about 70 to 80%.  Plan: At this point I reinjected the area today and after discussing this with her we injected it with 20 mg Kenalog 5 mg Marcaine she will continue her meloxicam.  She will continue her plantar fascial brace and night splint as well as appropriate shoe gear I will follow-up with her in 6 weeks if necessary otherwise I will follow-up with her on a as needed basis.  May need to consider orthotics if she regresses.

## 2020-08-15 ENCOUNTER — Encounter: Payer: Self-pay | Admitting: Family Medicine

## 2020-09-20 ENCOUNTER — Ambulatory Visit: Payer: 59 | Admitting: Podiatry

## 2020-09-23 IMAGING — CT CT ANGIO CHEST
2 of 8 series · 15 of 46 positions shown · IV contrast (OMNIPAQUE 350)
Comparison: 06/12/2013

CLINICAL DATA: Iliac artery aneurysm. Family history of aneurysm
and dissection. Currently asymptomatic.

EXAM:
CT ANGIOGRAPHY CHEST, ABDOMEN AND PELVIS
TECHNIQUE: Multidetector CT imaging through the chest, abdomen and pelvis was
performed using the standard protocol during bolus administration of
intravenous contrast. Multiplanar reconstructed images and MIPs were
obtained and reviewed to evaluate the vascular anatomy.
CONTRAST:  100mL OMNIPAQUE IOHEXOL 350 MG/ML SOLN

[Series 4: aorta 3.0 i31f 2 · axial · 0.72mm/px · z∈[-658,-55]mm · 12 of 225 slices shown]
[im 12/225  lung]
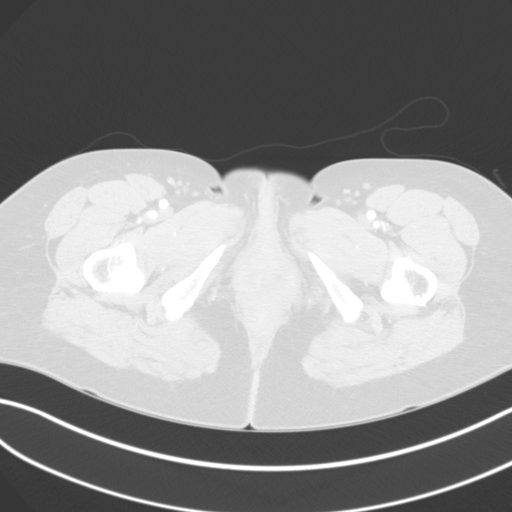
[im 36/225  soft-tissue]
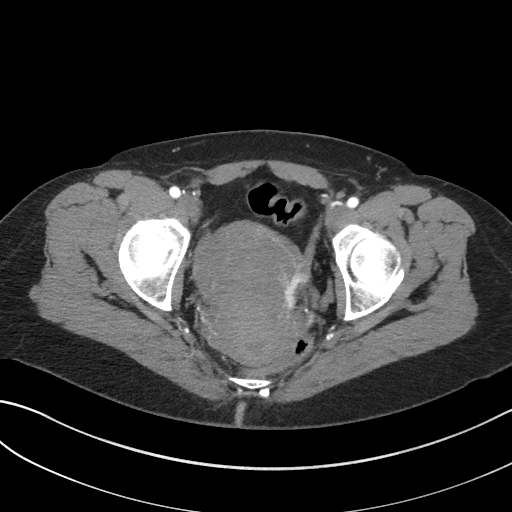
[im 48/225  lung]
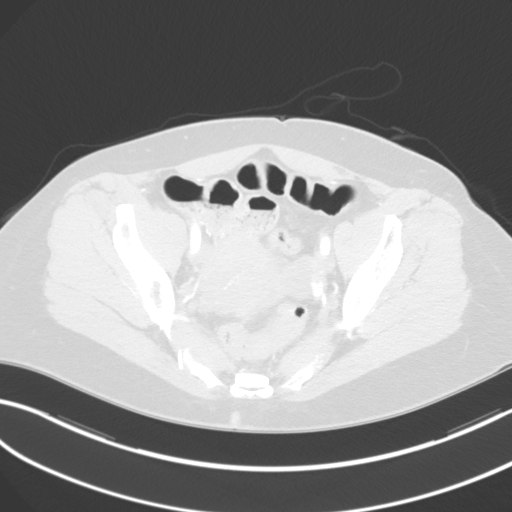
[im 71/225  soft-tissue]
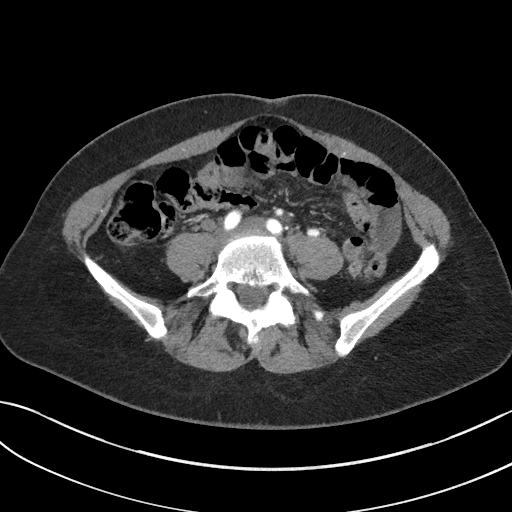
[im 83/225  lung]
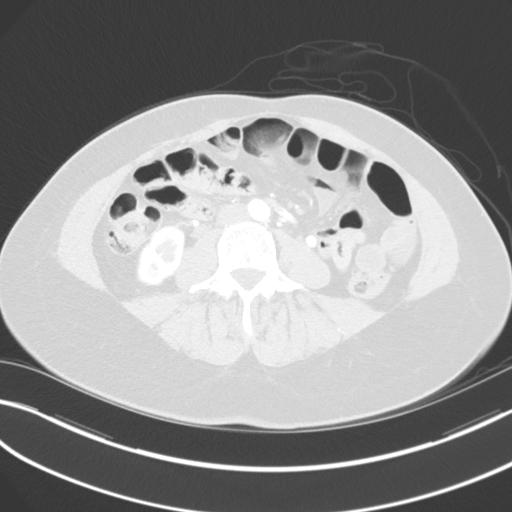
[im 107/225  soft-tissue]
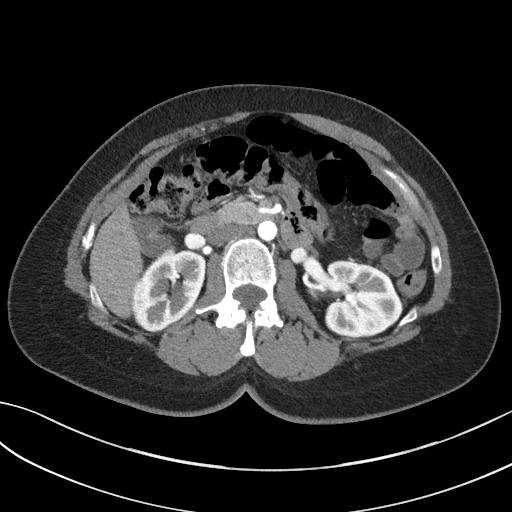
[im 118/225  lung]
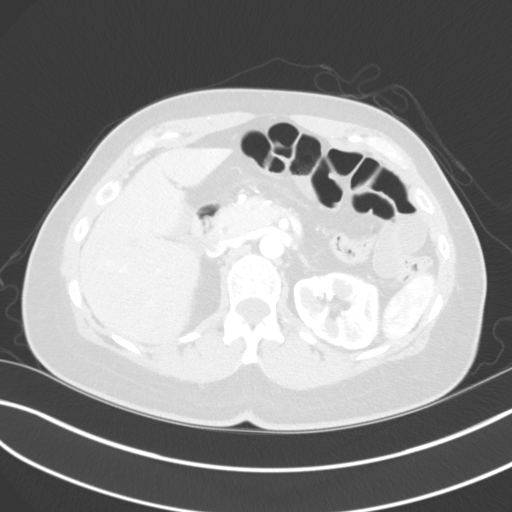
[im 142/225  soft-tissue]
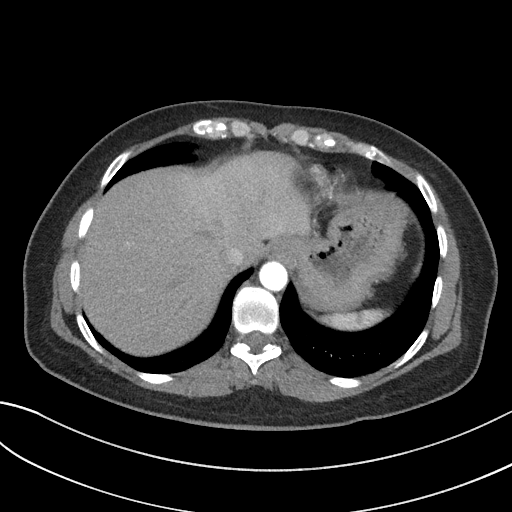
[im 154/225  lung]
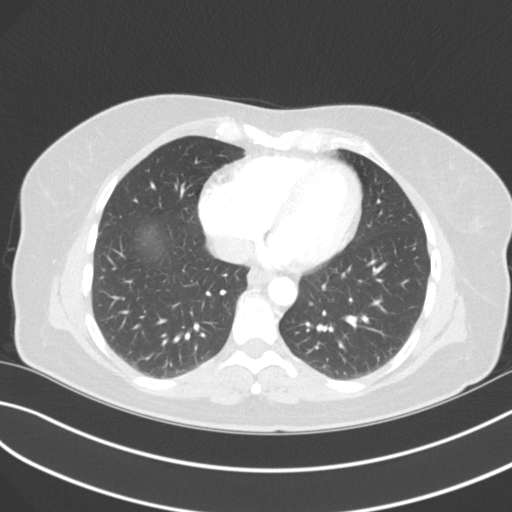
[im 177/225  soft-tissue]
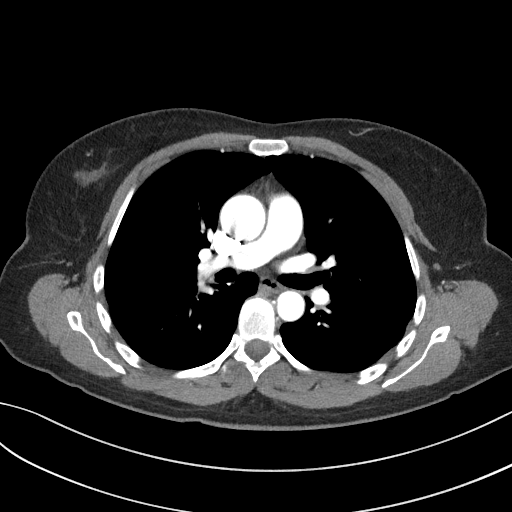
[im 189/225  lung]
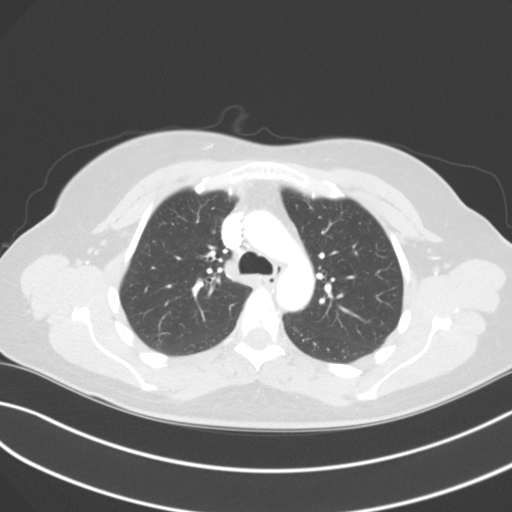
[im 213/225  soft-tissue]
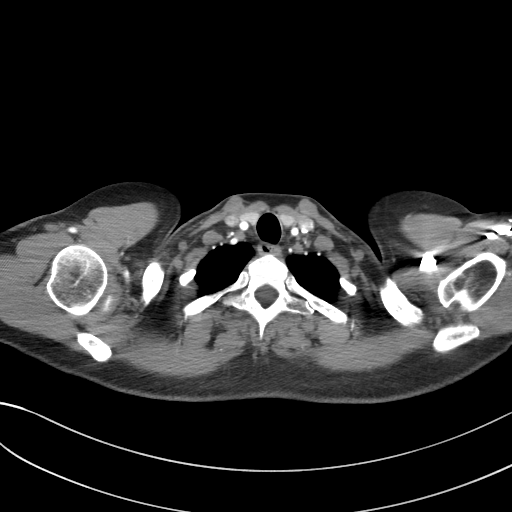

[Series 7: coronals · coronal · 0.66mm/px · 3 of 119 slices shown]
[im 30/119  soft-tissue]
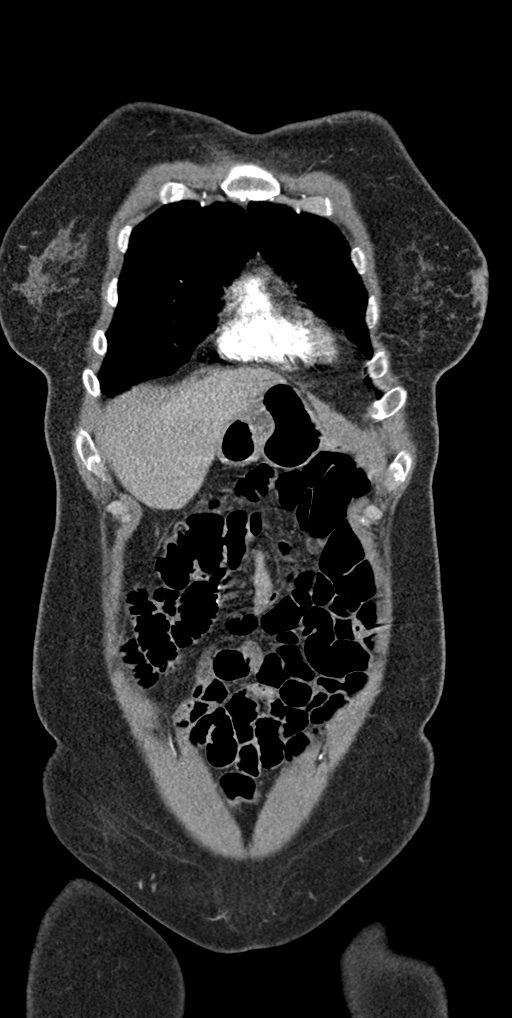
[im 60/119  soft-tissue]
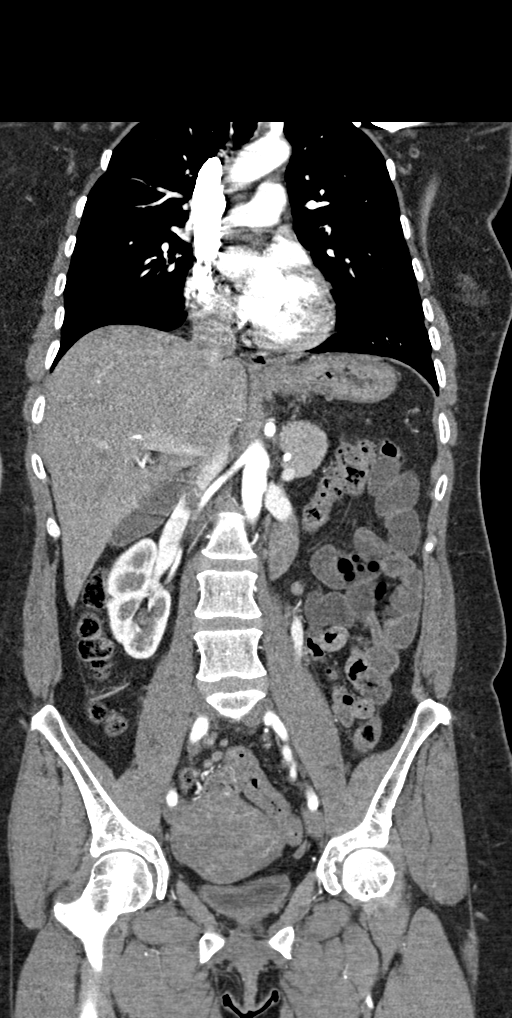
[im 89/119  soft-tissue]
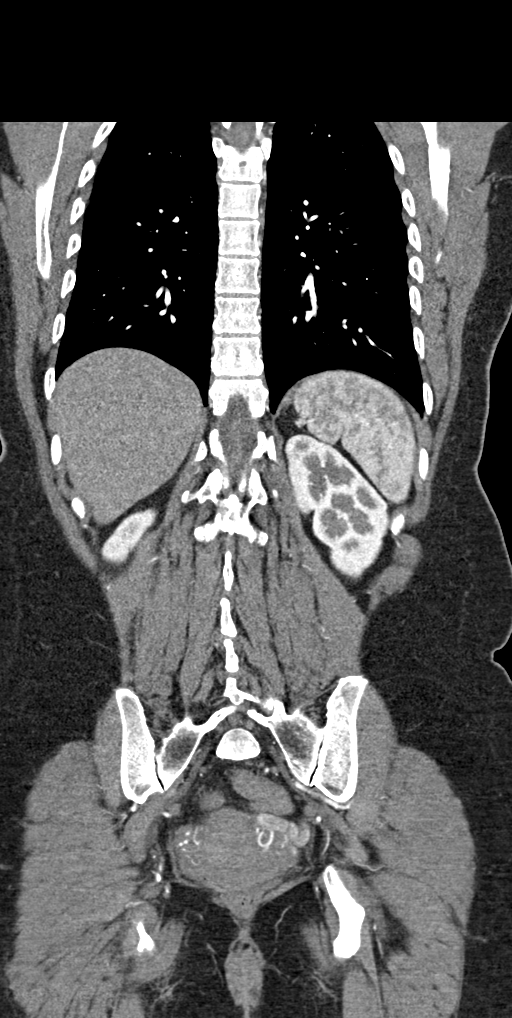

[15 of 46 positions shown; findings below may reference images not displayed]

FINDINGS: CTA CHEST FINDINGS

Cardiovascular: Heart size normal. No pericardial effusion. Fair
contrast opacification of pulmonary arterial tree; the exam was not
optimized for detection of pulmonary emboli.

Adequate contrast opacification of the thoracic aorta with no
evidence of dissection, aneurysm, or stenosis. There is classic
3-vessel brachiocephalic arch anatomy without proximal stenosis. No
significant atheromatous change.

Mediastinum/Nodes: Esophagus is nondilated. No hilar or mediastinal
adenopathy.

Lungs/Pleura: No pleural effusion. No pneumothorax. Lungs clear.

Musculoskeletal: No chest wall abnormality. No acute or significant
osseous findings.

Review of the MIP images confirms the above findings.

CTA ABDOMEN AND PELVIS FINDINGS

VASCULAR

Aorta: Normal caliber aorta without aneurysm, dissection, vasculitis
or significant stenosis.

Celiac: Patent without evidence of aneurysm, dissection, vasculitis
or significant stenosis.

SMA: Patent without evidence of aneurysm, dissection, vasculitis or
significant stenosis. Replaced right hepatic arterial supply, an
anatomic variant.

Renals: Both renal arteries are patent without evidence of aneurysm,
dissection, vasculitis, fibromuscular dysplasia or significant
stenosis.

IMA: Patent without evidence of aneurysm, dissection, vasculitis or
significant stenosis.

Inflow: Patent without evidence of aneurysm, dissection, vasculitis
or significant stenosis. Visualized proximal lower extremity
arterial outflow unremarkable.

Veins: Patent bilateral renal veins. Refluxing dilated left ovarian
vein. No other significant venous pathology identified within the
limitations of this arterial phase study.

Review of the MIP images confirms the above findings.

NON-VASCULAR

Hepatobiliary: No focal liver abnormality is seen. No gallstones,
gallbladder wall thickening, or biliary dilatation.

Pancreas: Unremarkable. No pancreatic ductal dilatation or
surrounding inflammatory changes.

Spleen: Normal in size without focal abnormality.

Adrenals/Urinary Tract: Adrenal glands are unremarkable. Kidneys are
normal, without renal calculi, focal lesion, or hydronephrosis.
Bladder is unremarkable.

Stomach/Bowel: Stomach and small bowel decompressed. Appendix not
discretely identified. No pericecal inflammatory/edematous change.
The colon is nondilated, unremarkable.

Lymphatic: No abdominal or pelvic pathology.

Reproductive: Uterus unremarkable. 2.7 cm left ovarian cyst. Mildly
dilated tortuous left parametrial/adnexal veins.

Other: No ascites. No free air.

Musculoskeletal: No acute or significant osseous findings.

Review of the MIP images confirms the above findings.
IMPRESSION: 1. Negative for thoracic/aortoiliac aneurysm, dissection or other
acute findings.
2. Dilated tortuous left parametrial/adnexal veins and refluxing
left ovarian vein. Correlate with any clinical evidence of pelvic
congestion syndrome.

## 2021-02-20 ENCOUNTER — Encounter: Payer: Self-pay | Admitting: Podiatry

## 2021-03-07 ENCOUNTER — Other Ambulatory Visit: Payer: Self-pay | Admitting: Family Medicine

## 2021-03-07 DIAGNOSIS — I10 Essential (primary) hypertension: Secondary | ICD-10-CM

## 2021-03-07 NOTE — Telephone Encounter (Signed)
I have called pt to get her scheduled for ov. It has been over a year since we saw pt. LMTCB  [place] refill of rx 30 day supply sent to pharmacy.

## 2021-03-08 NOTE — Progress Notes (Addendum)
[place] [facility] [address] [place], [place] [address] [phone]  Date:  03/09/2021   Name:  Whitney Bowman   DOB:  1971-06-09   MRN:  [id number]  PCP:  Darreld Mclean, MD    Chief Complaint: Medication Refill (amlodipine)   History of Present Illness:  Whitney Bowman is a 50 y.o. very pleasant female patient who presents with the following:  Generally healthy woman with history of Graves' disease, palpitations, essential hypertension on amlodipine Last seen by myself in October 2020   She is a Programmer, multimedia, married to [place].  They have 2 teenage sons;13 and 11 yo They went to [facility] over the summer and had good time although it was hot  Her oldest is a senior Seen today for medication follow-up Most recent visit with myself about one year ago  She had covid in January of this year  Her Graves disease has been in remission- I am now following her TSH and free T3  Negative cologuard last year   Pap- per GYN She did an ablation in December for bleeding  Covid booster- not done yet  She did have the original series last year  Some labs done in December   She does not check her BP at home   Patient Active Problem List   Diagnosis Date Noted   Essential hypertension 03/03/2020   Lateral epicondylitis of right elbow 06/18/2017   Graves disease 12/03/2016   Chest pain 11/13/2013   CAP (community acquired pneumonia) 08/22/2012   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 1991   Hypertension    Intermittent palpitations    cardiologist--- dr t. turner--  normal ETT ,echo , and CTA normal   Wears glasses     Past Surgical History:  Procedure Laterality Date   DILATATION & CURRETTAGE/HYSTEROSCOPY WITH RESECTOCOPE N/A 07/19/2020   Procedure: DILATATION & CURETTAGE/HYSTEROSCOPY WITH RESECTOCOPE;  Surgeon: Servando Salina, MD;  Location: [place] [facility];  Service: Gynecology;  Laterality: N/A;   HYSTEROSCOPY WITH RESECTOSCOPE N/A 07/19/2020   Procedure: HYSTEROSCOPY WITH RESECTOSCOPE;  Surgeon: Servando Salina, MD;  Location: [place];  Service: Gynecology;  Laterality: N/A;   UMBILICAL HERNIA REPAIR  08-09-2009  @[facility]     Social History   Tobacco Use   Smoking status: Never   Smokeless tobacco: Never  Vaping Use   Vaping Use: Never used  Substance Use Topics   Alcohol use: Yes    Alcohol/week: 4.0 - 5.0 standard drinks    Types: 4 - 5 Glasses of wine per week   Drug use: Never    Family History  Problem Relation Age of Onset   Hypertension Mother    Hypertension Father     Allergies  Allergen Reactions   Cefdinir Diarrhea  REACTION: PROTRACTED DIARRHEA/ COLITIS   Shellfish Allergy Diarrhea and Nausea Only   Sudafed [Pseudoephedrine Hcl]     Tachycardia   Cephalosporins Diarrhea    severe   Codeine Nausea And Vomiting    Medication list has been reviewed and updated.  Current Outpatient Medications on File Prior to Visit  Medication Sig Dispense Refill   amLODipine (NORVASC) 2.5 MG tablet TAKE ONE TABLET BY MOUTH DAILY 30 tablet 0   cyclobenzaprine (FLEXERIL) 5 MG tablet Take 5 mg by mouth as needed (muscle spasms/cramps).      ibuprofen (ADVIL) 800 MG tablet Take 1 tablet (800 mg total) by mouth every 8 (eight) hours as needed for moderate pain. 30 tablet 9   meloxicam (MOBIC) 15 MG tablet Take  15 mg by mouth daily.     Multiple Vitamin (MULTIVITAMIN) tablet Take 1 tablet by mouth daily.     Probiotic Product (PROBIOTIC DAILY PO) Take by mouth daily as needed.     No current facility-administered medications on file prior to visit.    Review of Systems:  As per HPI- otherwise negative.   Physical Examination: Vitals:   03/09/21 1039  BP: 116/72  Pulse: 78  Resp: 15  Temp: 97.6 F (36.4 C)  SpO2: 98%   Vitals:   03/09/21 1039  Weight: 169 lb (76.7 kg)  Height: 5' 8"  (1.727 m)   Body mass index is 25.7 kg/m. Ideal Body Weight: Weight in (lb) to have BMI = 25: 164.1  GEN: no acute distress.  Minimal overweight, looks well HEENT: Atraumatic, Normocephalic.  Bilateral TM wnl, oropharynx normal.  PEERL,EOMI.   Ears and Nose: No external deformity. CV: RRR, No M/G/R. No JVD. No thrill. No extra heart sounds. PULM: CTA B, no wheezes, crackles, rhonchi. No retractions. No resp. distress. No accessory muscle use. ABD: S, NT, ND, +BS. No rebound. No HSM. EXTR: No c/c/e PSYCH: Normally interactive. Conversant.    Assessment and Plan: Screening for deficiency anemia - Plan: CBC  Essential hypertension - Plan: amLODipine (NORVASC) 2.5 MG tablet  Screening for hyperlipidemia - Plan: Lipid panel  Screening for diabetes mellitus - Plan: Comprehensive metabolic panel, Hemoglobin A1c  Graves disease - Plan: TSH, T3, free  Fatigue, unspecified type - Plan: VITAMIN D 25 Hydroxy (Vit-D Deficiency, Fractures)  Following up today.  Labs are pending as above.  We discussed her blood pressure, she is on a very low-dose of amlodipine and her pressure is on the low side.  She does have a blood pressure cuff, will start doing some home monitoring.  If blood pressures consistently low she can come off amlodipine and see how she does  Will plan further follow- up pending labs. Requested colonoscopy report This visit occurred during the SARS-CoV-2 public health emergency.  Safety  protocols were in place, including screening questions prior to the visit, additional usage of staff PPE, and extensive cleaning of exam room while observing appropriate contact time as indicated for disinfecting solutions.   Signed Lamar Blinks, MD  Received her labs as below, message to patient Her TSH is suppressed again, I will follow-up with Dr. Cruzita Lederer  Results for orders placed or performed in visit on 03/09/21  CBC  Result Value Ref Range   WBC 4.7 4.0 - 10.5 K/uL   RBC 4.08 3.87 - 5.11 Mil/uL   Platelets 262.0 150.0 - 400.0 K/uL   Hemoglobin 12.8 12.0 - 15.0 g/dL   HCT 36.9 36.0 - 46.0 %   MCV 90.4 78.0 -  100.0 fl   MCHC 34.7 30.0 - 36.0 g/dL   RDW 12.8 11.5 - 15.5 %  Comprehensive metabolic panel  Result Value Ref Range   Sodium 138 135 - 145 mEq/L   Potassium 4.8 3.5 - 5.1 mEq/L   Chloride 103 96 - 112 mEq/L   CO2 28 19 - 32 mEq/L   Glucose, Bld 88 70 - 99 mg/dL   BUN 14 6 - 23 mg/dL   Creatinine, Ser 0.64 0.40 - 1.20 mg/dL   Total Bilirubin 0.5 0.2 - 1.2 mg/dL   Alkaline Phosphatase 56 39 - 117 U/L   AST 15 0 - 37 U/L   ALT 11 0 - 35 U/L   Total Protein 7.1 6.0 - 8.3 g/dL   Albumin 4.5 3.5 - 5.2 g/dL   GFR 104.01 >60.00 mL/min   Calcium 9.7 8.4 - 10.5 mg/dL  Hemoglobin A1c  Result Value Ref Range   Hgb A1c MFr Bld 5.4 4.6 - 6.5 %  Lipid panel  Result Value Ref Range   Cholesterol 201 (H) 0 - 200 mg/dL   Triglycerides 148.0 0.0 - 149.0 mg/dL   HDL 76.10 >39.00 mg/dL   VLDL 29.6 0.0 - 40.0 mg/dL   LDL Cholesterol 95 0 - 99 mg/dL   Total CHOL/HDL Ratio 3    NonHDL 124.77   TSH  Result Value Ref Range   TSH <0.01 Repeated and verified X2. (L) 0.35 - 5.50 uIU/mL  VITAMIN D 25 Hydroxy (Vit-D Deficiency, Fractures)  Result Value Ref Range   VITD 43.15 30.00 - 100.00 ng/mL  T3, free  Result Value Ref Range   T3, Free 5.0 (H) 2.3 - 4.2 pg/mL

## 2021-03-09 ENCOUNTER — Encounter: Payer: Self-pay | Admitting: Family Medicine

## 2021-03-09 ENCOUNTER — Ambulatory Visit: Payer: 59 | Admitting: Family Medicine

## 2021-03-09 ENCOUNTER — Other Ambulatory Visit: Payer: Self-pay

## 2021-03-09 VITALS — BP 116/72 | HR 78 | Temp 97.6°F | Resp 15 | Ht 68.0 in | Wt 169.0 lb

## 2021-03-09 DIAGNOSIS — E0500 Thyrotoxicosis with diffuse goiter without thyrotoxic crisis or storm: Secondary | ICD-10-CM | POA: Diagnosis not present

## 2021-03-09 DIAGNOSIS — I10 Essential (primary) hypertension: Secondary | ICD-10-CM

## 2021-03-09 DIAGNOSIS — R5383 Other fatigue: Secondary | ICD-10-CM | POA: Diagnosis not present

## 2021-03-09 DIAGNOSIS — Z13220 Encounter for screening for lipoid disorders: Secondary | ICD-10-CM

## 2021-03-09 DIAGNOSIS — Z130 Encounter for screening for diseases of the blood and blood-forming organs and certain disorders involving the immune mechanism: Secondary | ICD-10-CM | POA: Diagnosis not present

## 2021-03-09 DIAGNOSIS — Z131 Encounter for screening for diabetes mellitus: Secondary | ICD-10-CM | POA: Diagnosis not present

## 2021-03-09 LAB — LIPID PANEL
Cholesterol: 201 mg/dL — ABNORMAL HIGH (ref 0–200)
HDL: 76.1 mg/dL (ref >39.00)
LDL Cholesterol: 95 mg/dL (ref 0–99)
NonHDL: 124.77
Total CHOL/HDL Ratio: 3
Triglycerides: 148 mg/dL (ref 0.0–149.0)
VLDL: 29.6 mg/dL (ref 0.0–40.0)

## 2021-03-09 LAB — CBC
HCT: 36.9 % (ref 36.0–46.0)
Hemoglobin: 12.8 g/dL (ref 12.0–15.0)
MCHC: 34.7 g/dL (ref 30.0–36.0)
MCV: 90.4 fl (ref 78.0–100.0)
Platelets: 262 10*3/uL (ref 150.0–400.0)
RBC: 4.08 Mil/uL (ref 3.87–5.11)
RDW: 12.8 % (ref 11.5–15.5)
WBC: 4.7 10*3/uL (ref 4.0–10.5)

## 2021-03-09 LAB — HEMOGLOBIN A1C: Hgb A1c MFr Bld: 5.4 % (ref 4.6–6.5)

## 2021-03-09 LAB — COMPREHENSIVE METABOLIC PANEL
ALT: 11 U/L (ref 0–35)
AST: 15 U/L (ref 0–37)
Albumin: 4.5 g/dL (ref 3.5–5.2)
Alkaline Phosphatase: 56 U/L (ref 39–117)
BUN: 14 mg/dL (ref 6–23)
CO2: 28 mEq/L (ref 19–32)
Calcium: 9.7 mg/dL (ref 8.4–10.5)
Chloride: 103 mEq/L (ref 96–112)
Creatinine, Ser: 0.64 mg/dL (ref 0.40–1.20)
GFR: 104.01 mL/min (ref >60.00)
Glucose, Bld: 88 mg/dL (ref 70–99)
Potassium: 4.8 mEq/L (ref 3.5–5.1)
Sodium: 138 mEq/L (ref 135–145)
Total Bilirubin: 0.5 mg/dL (ref 0.2–1.2)
Total Protein: 7.1 g/dL (ref 6.0–8.3)

## 2021-03-09 LAB — TSH: TSH: <0.01 u[IU]/mL — ABNORMAL LOW (ref 0.35–5.50)

## 2021-03-09 LAB — VITAMIN D 25 HYDROXY (VIT D DEFICIENCY, FRACTURES): VITD: 43.15 ng/mL (ref 30.00–100.00)

## 2021-03-09 LAB — T3, FREE: T3, Free: 5 pg/mL — ABNORMAL HIGH (ref 2.3–4.2)

## 2021-03-09 MED ORDER — AMLODIPINE BESYLATE 2.5 MG PO TABS
2.5000 mg | ORAL_TABLET | Freq: Every day | ORAL | 3 refills | Status: DC
Start: 2021-03-09 — End: 2022-04-05

## 2021-03-09 NOTE — Patient Instructions (Signed)
Great to see you again today- take care and I will be in touch with your labs

## 2021-03-13 ENCOUNTER — Other Ambulatory Visit: Payer: Self-pay | Admitting: Family Medicine

## 2021-03-13 ENCOUNTER — Encounter: Payer: Self-pay | Admitting: Family Medicine

## 2021-03-13 DIAGNOSIS — E0500 Thyrotoxicosis with diffuse goiter without thyrotoxic crisis or storm: Secondary | ICD-10-CM

## 2021-03-13 MED ORDER — METHIMAZOLE 5 MG PO TABS: 5.0000 mg | ORAL_TABLET | Freq: Two times a day (BID) | ORAL | 3 refills | Status: DC

## 2021-03-13 NOTE — Progress Notes (Signed)
Message from Dr Cruzita Lederer-   At this point, I would restart methimazole, at 5 mg twice a day and recheck her tests in approximately 4 weeks.  We can stay in touch at that time.  If tests are normal, I would probably continue this dose for a little longer with close follow-up.  If the TSH starts to increase within the normal range, I would reduce the dose to 5 mg once a day. Hope this helps!  I can also start seeing her again, if you prefer.  Let me know.

## 2021-03-22 ENCOUNTER — Other Ambulatory Visit: Payer: Self-pay | Admitting: Obstetrics and Gynecology

## 2021-03-22 DIAGNOSIS — Z1231 Encounter for screening mammogram for malignant neoplasm of breast: Secondary | ICD-10-CM

## 2021-04-17 ENCOUNTER — Other Ambulatory Visit (INDEPENDENT_AMBULATORY_CARE_PROVIDER_SITE_OTHER): Payer: 59

## 2021-04-17 ENCOUNTER — Encounter: Payer: Self-pay | Admitting: Family Medicine

## 2021-04-17 ENCOUNTER — Other Ambulatory Visit: Payer: Self-pay

## 2021-04-17 DIAGNOSIS — E0500 Thyrotoxicosis with diffuse goiter without thyrotoxic crisis or storm: Secondary | ICD-10-CM | POA: Diagnosis not present

## 2021-04-17 LAB — TSH: TSH: <0.01 u[IU]/mL — ABNORMAL LOW (ref 0.35–5.50)

## 2021-04-17 LAB — T3, FREE: T3, Free: 3.6 pg/mL (ref 2.3–4.2)

## 2021-04-20 ENCOUNTER — Encounter: Payer: Self-pay | Admitting: Family Medicine

## 2021-04-20 ENCOUNTER — Other Ambulatory Visit: Payer: Self-pay | Admitting: Family Medicine

## 2021-04-20 DIAGNOSIS — E0500 Thyrotoxicosis with diffuse goiter without thyrotoxic crisis or storm: Secondary | ICD-10-CM

## 2021-04-28 ENCOUNTER — Ambulatory Visit: Payer: 59 | Admitting: Internal Medicine

## 2021-05-09 ENCOUNTER — Ambulatory Visit
Admission: RE | Admit: 2021-05-09 | Discharge: 2021-05-09 | Disposition: A | Discharge status: Home / Self Care | Payer: 59 | Source: Ambulatory Visit | Attending: Obstetrics and Gynecology | Admitting: Obstetrics and Gynecology

## 2021-05-09 ENCOUNTER — Other Ambulatory Visit: Payer: Self-pay

## 2021-05-09 DIAGNOSIS — Z1231 Encounter for screening mammogram for malignant neoplasm of breast: Secondary | ICD-10-CM

## 2021-05-10 ENCOUNTER — Encounter: Payer: Self-pay | Admitting: Internal Medicine

## 2021-05-11 ENCOUNTER — Ambulatory Visit: Payer: 59 | Admitting: Internal Medicine

## 2021-05-11 ENCOUNTER — Other Ambulatory Visit: Payer: Self-pay

## 2021-05-11 VITALS — BP 114/78 | HR 98 | Ht 68.0 in | Wt 170.0 lb

## 2021-05-11 DIAGNOSIS — E0500 Thyrotoxicosis with diffuse goiter without thyrotoxic crisis or storm: Secondary | ICD-10-CM

## 2021-05-11 LAB — T4, FREE: Free T4: 0.85 ng/dL (ref 0.60–1.60)

## 2021-05-11 LAB — T3, FREE: T3, Free: 3.5 pg/mL (ref 2.3–4.2)

## 2021-05-11 LAB — TSH: TSH: 0.01 u[IU]/mL — ABNORMAL LOW (ref 0.35–5.50)

## 2021-05-11 NOTE — Patient Instructions (Addendum)
Please continue Methimazole 5 mg 1-2x daily.  Please stop at the lab.  Please come back for a follow-up appointment in 3-4 months.

## 2021-05-11 NOTE — Progress Notes (Signed)
Patient ID: IZZY DOUBEK, female   DOB: 05/27/1971, 50 y.o.   MRN: [id number]   This visit occurred during the SARS-CoV-2 public health emergency.  Safety protocols were in place, including screening questions prior to the visit, additional usage of staff PPE, and extensive cleaning of exam room while observing appropriate contact time as indicated for disinfecting solutions.   HPI  OFILIA RAYON is a 50 y.o.-year-old female, initially referred by her PCP, Dr. Lorelei Pont for evaluation for Graves' disease. Last visit 2 years and 10 months.  Patient was referred back to endocrinology after she started to experience a Graves' disease recurrence. I have been in contact with PCP and was started patient back on methimazole, currently on 5 mg twice a day, but takes it mostly 1x a day as she forgets the second dose 50% of the time.  Interim hx: She denies unintentional weight loss and tremors but has restlessness, insomnia, heat intolerance (hot flashes), palpitations (elevated pulse especially after meals) and more frequent stools.  She feels that these improved after starting methimazole. At last visit, she had DUB and was previously on OCPs but stopped due to weight gain.  However, since then, she had D&C in 06/2020.  Reviewed and addended history: Pt's TFTs were found to be abnormal this Spring- during an APE. Retrospectively, she had tachycardia for 10 years after the birth of her second child >> palpitations.  She started taking a cleanse supplement with iodine, kelp, seaweed (list of ingredients - scanned) in mid-07/2016 >> taking it for 2 months when she had labs in 09/2016.  She stopped these right after the labs returned.  She remembers having had a URI  in 06/2017: Bronchitis/sinusitis.  We started methimazole 5 mg twice daily.    We were able to decrease the dose to 2.5 mg daily in 06/2017 and I advised her to stop the methimazole completely in 09/2017.  She read the message through my  chart but did not stop the medication.     At our visit from 06/2018, I advised him to stop methimazole completely, which she did.   However, in 02/2020, she had a mildly low TSH and a year later, in 02/2021 TSH was undetectable.  Methimazole was restarted then.  TFTs remains normal: Lab Results  Component Value Date   TSH <0.01 (L) 04/17/2021   TSH <0.01 Repeated and verified X2. (L) 03/09/2021   TSH 0.32 (L) 03/07/2020   TSH 1.68 12/17/2018   TSH 2.00 09/02/2018   TSH 2.63 07/16/2018   TSH 2.09 01/24/2018   TSH 3.31 10/08/2017   TSH 2.38 07/11/2017   TSH 4.47 05/23/2017   FREET4 0.97 12/17/2018   FREET4 0.93 09/02/2018   FREET4 0.68 07/16/2018   FREET4 0.84 01/24/2018   FREET4 0.74 10/08/2017   FREET4 0.76 07/11/2017   FREET4 0.73 05/23/2017   FREET4 0.64 04/08/2017   FREET4 0.70 02/15/2017   FREET4 1.4 12/21/2016   T3FREE 3.6 04/17/2021   T3FREE 5.0 (H) 03/09/2021   T3FREE 3.6 03/07/2020   T3FREE 3.3 12/17/2018   T3FREE 3.6 09/02/2018   T3FREE 3.5 07/16/2018   T3FREE 3.3 01/24/2018   T3FREE 3.3 10/08/2017   T3FREE 3.6 07/11/2017   T3FREE 3.1 05/23/2017   Graves' antibodies were elevated: Lab Results  Component Value Date   TSI <89 07/16/2018   TSI 241 (H) 12/21/2016   Her thyrotropin receptor antibodies were elevated: Component     Latest Ref Rng & Units 10/25/2016  Thyrotropin Receptor Ab     <=  16.0 % 40.5 (H)    Pt denies: - feeling nodules in neck - hoarseness - dysphagia - choking - SOB with lying down  She has a history of palpitations (after her second pregnancy: Sees Dr. Radford Pax with cardiology every other year; had a Holter monitor), insomnia, fatigue, initially weight loss but she has gained weight afterwards, anxiety, excessive sweating and heat intolerance, mild occasional tremors.  The symptoms are resolved except for weight gain.  Pt does not have a FH of thyroid ds. No FH of thyroid cancer. No h/o radiation tx to head or neck.  No recent  steroids use. She has family history of RA.  ROS:  + see HPI  I reviewed pt's medications, allergies, PMH, social hx, family hx, and changes were documented in the history of present illness. Otherwise, unchanged from my initial visit note.  Past Medical History:  Diagnosis Date   DUB (dysfunctional uterine bleeding)    History of closed head injury    per pt age 50 fell and had closed skull fracture , stated brief loc by no coma , and no residuals   History of Graves' disease    endocrinologist--  dr Cruzita Lederer-- dx 2018,  was on medication until 2019 due to lab work normallized, felt it may have to have taking OTC seaweed with four times the iodine   History of positive PPD    per pt positive skin test and cxr was abnormal was medication for a full year approx. 1991   Hypertension    Intermittent palpitations    cardiologist--- dr t. turner--  normal ETT ,echo , and CTA normal   Wears glasses    Past Surgical History:  Procedure Laterality Date   DILATATION & CURRETTAGE/HYSTEROSCOPY WITH RESECTOCOPE N/A 07/19/2020   Procedure: DILATATION & CURETTAGE/HYSTEROSCOPY WITH RESECTOCOPE;  Surgeon: Servando Salina, MD;  Location: [place];  Service: Gynecology;  Laterality: N/A;   HYSTEROSCOPY WITH RESECTOSCOPE N/A 07/19/2020   Procedure: HYSTEROSCOPY WITH RESECTOSCOPE;  Surgeon: Servando Salina, MD;  Location: [place];  Service: Gynecology;  Laterality: N/A;   UMBILICAL HERNIA REPAIR  08-09-2009  @[facility]    Social History   Social History   Marital status: Married    Spouse name: N/A   Number of children: 2 sons   Occupational History   Programmer, 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 90 tablet 3   cyclobenzaprine (FLEXERIL) 5 MG tablet Take 5 mg by mouth as needed (muscle spasms/cramps).      ibuprofen (ADVIL) 800 MG tablet Take 1 tablet (800 mg total) by mouth every 8 (eight) hours as needed for moderate pain. 30 tablet 9   meloxicam (MOBIC) 15 MG tablet Take 15 mg by mouth daily.     methimazole (TAPAZOLE) 5 MG tablet Take 1 tablet (5 mg total) by mouth 2 (two) times daily. 60 tablet 3   Multiple Vitamin (MULTIVITAMIN) tablet Take 1 tablet by mouth daily.     Probiotic Product (PROBIOTIC DAILY PO) Take by mouth daily as needed.     No current facility-administered medications on file  prior to visit.   Allergies  Allergen Reactions   Cefdinir Diarrhea    REACTION: PROTRACTED DIARRHEA/ COLITIS   Shellfish Allergy Diarrhea and Nausea Only   Sudafed [Pseudoephedrine Hcl]     Tachycardia   Cephalosporins Diarrhea    severe   Codeine Nausea And Vomiting   Family History  Problem Relation Age of Onset   Hypertension Mother    Hypertension Father   Mother and father also had melanoma.  PE: BP 114/78 (BP Location: Right Arm, Patient Position: Sitting, Cuff Size: Normal)   Pulse 98   Ht 5' 8"  (1.727 m)   Wt 170 lb (77.1 kg)   SpO2 100%   BMI 25.85 kg/m  Wt Readings from Last 3 Encounters:  05/11/21 170 lb (77.1 kg)  03/09/21 169 lb (76.7 kg)  07/19/20 168 lb 14.4 oz (76.6 kg)   Constitutional: slightly overweight, in NAD Eyes: PERRLA, EOMI, no exophthalmos ENT: moist mucous membranes, no thyromegaly, no cervical lymphadenopathy Cardiovascular: tachycardia, RR, No MRG Respiratory: CTA B Gastrointestinal: abdomen soft, NT, ND, BS+ Musculoskeletal: no deformities, strength intact in all 4 Skin: moist, warm, no rashes Neurological: no tremor with outstretched hands, DTR normal in all 4  ASSESSMENT: 1.Graves' disease -recurrent  PLAN:  1. Patient with history of Graves' disease, diagnosed in  2018, with initially mild thyrotoxic symptoms: Mild weight loss, some heat intolerance, palpitations, anxiety, tremor.  Her TSI antibodies and thyrotropin receptor antibodies returned positive.  We initially started methimazole 5 mg twice a day, and we were then able to reduce the dose and even stop completely at last visit, in 06/2018.  Patient was lost for follow-up afterwards.  Her PCP contacted me recently with undetectable TSH levels from 08 and 03/2021.  At that time, I advised her to restart methimazole.  Patient presents for follow-up today. -She is currently on methimazole 5 mg twice a day, but takes it mostly once a day, forgetting the second dose 50% of the time.  She does mention that after starting methimazole, she started to feel better, especially with less frequent stools and also sleeping better.  She does have palpitations occasionally.  She is followed by Dr. Radford Pax in cardiology for these.  She is not on a beta-blocker.  At today's visit, pulse is 98.  At home, she feels that her heart rate is high sometimes even without activity and especially after meals. -At this time, we will repeat her TFTs and add TSI antibodies.  We did discuss that if her TFTs are still abnormal, I would suggest a beta-blocker, but would definitely need to run this by her cardiologist. -We again reviewed possible treatments for Graves' disease including RAI ablation and thyroidectomy but also continuing methimazole low-dose -no signs or symptoms of active Graves' ophthalmopathy: No blurry vision, eye pain, exophthalmos, chemosis -I will see the patient back in 3 to 4 months but possibly sooner for labs  - Total time spent for the visit: 25 min, in obtaining medical information from the chart and from the patient, reviewing communication with PCP since last visit, reviewing her  previous labs, imaging evaluations, and treatments, reviewing her symptoms, counseling her about her condition (please see the discussed  topics above), and developing a plan to further investigate and treat it; she had a number of questions which I addressed.  Office Visit on 05/11/2021  Component Date Value Ref Range Status   TSH 05/11/2021 0.01 (A) 0.35 - 5.50 uIU/mL Final   Free T4 05/11/2021 0.85  0.60 -  1.60 ng/dL Final   Comment: Specimens from patients who are undergoing biotin therapy and /or ingesting biotin supplements may contain high levels of biotin.  The higher biotin concentration in these specimens interferes with this Free T4 assay.  Specimens that contain high levels  of biotin may cause false high results for this Free T4 assay.  Please interpret results in light of the total clinical presentation of the patient.     T3, Free 05/11/2021 3.5  2.3 - 4.2 pg/mL Final   Message sent: Dear Apolonio Schneiders, Thyroid tests have improved on this dose of methimazole.  Please continue methimazole and you can take both tablets at the same time in the morning.  I would suggest to recheck your thyroid tests at the end of November. Please call our main office number [phone]) to schedule a lab appointment.  The Graves' antibodies have not returned yet.  Sincerely, Philemon Kingdom MD  Philemon Kingdom, MD PhD [facility] Endocr[facility]

## 2021-05-12 ENCOUNTER — Encounter: Payer: Self-pay | Admitting: Internal Medicine

## 2021-05-15 ENCOUNTER — Other Ambulatory Visit: Payer: Self-pay | Admitting: Obstetrics and Gynecology

## 2021-05-15 DIAGNOSIS — R928 Other abnormal and inconclusive findings on diagnostic imaging of breast: Secondary | ICD-10-CM

## 2021-05-16 LAB — THYROID STIMULATING IMMUNOGLOBULIN: TSI: 251 % baseline — ABNORMAL HIGH (ref <140)

## 2021-05-17 ENCOUNTER — Encounter: Payer: Self-pay | Admitting: Internal Medicine

## 2021-05-19 ENCOUNTER — Other Ambulatory Visit: Payer: Self-pay

## 2021-05-19 ENCOUNTER — Ambulatory Visit
Admission: RE | Admit: 2021-05-19 | Discharge: 2021-05-19 | Disposition: A | Discharge status: Home / Self Care | Payer: 59 | Source: Ambulatory Visit | Attending: Obstetrics and Gynecology | Admitting: Obstetrics and Gynecology

## 2021-05-19 DIAGNOSIS — R928 Other abnormal and inconclusive findings on diagnostic imaging of breast: Secondary | ICD-10-CM

## 2021-06-02 ENCOUNTER — Other Ambulatory Visit: Payer: 59

## 2021-06-27 ENCOUNTER — Other Ambulatory Visit (INDEPENDENT_AMBULATORY_CARE_PROVIDER_SITE_OTHER): Payer: 59

## 2021-06-27 ENCOUNTER — Other Ambulatory Visit: Payer: Self-pay

## 2021-06-27 ENCOUNTER — Other Ambulatory Visit: Payer: Self-pay | Admitting: Internal Medicine

## 2021-06-27 DIAGNOSIS — E0500 Thyrotoxicosis with diffuse goiter without thyrotoxic crisis or storm: Secondary | ICD-10-CM

## 2021-06-27 LAB — TSH: TSH: 2.23 u[IU]/mL (ref 0.35–5.50)

## 2021-06-27 LAB — T4, FREE: Free T4: 0.62 ng/dL (ref 0.60–1.60)

## 2021-06-27 LAB — T3, FREE: T3, Free: 3.4 pg/mL (ref 2.3–4.2)

## 2021-06-29 ENCOUNTER — Encounter: Payer: Self-pay | Admitting: Family Medicine

## 2021-06-29 ENCOUNTER — Ambulatory Visit (INDEPENDENT_AMBULATORY_CARE_PROVIDER_SITE_OTHER): Payer: 59 | Admitting: *Deleted

## 2021-06-29 DIAGNOSIS — Z23 Encounter for immunization: Secondary | ICD-10-CM

## 2021-06-29 NOTE — Progress Notes (Signed)
Patient here for flu vaccine.  Vaccine given in left deltoid and patient tolerated well.

## 2021-08-26 ENCOUNTER — Encounter: Payer: Self-pay | Admitting: Internal Medicine

## 2021-08-29 ENCOUNTER — Other Ambulatory Visit (INDEPENDENT_AMBULATORY_CARE_PROVIDER_SITE_OTHER): Payer: 59

## 2021-08-29 ENCOUNTER — Other Ambulatory Visit: Payer: Self-pay

## 2021-08-29 ENCOUNTER — Encounter: Payer: Self-pay | Admitting: Internal Medicine

## 2021-08-29 ENCOUNTER — Ambulatory Visit: Payer: 59 | Admitting: Internal Medicine

## 2021-08-29 VITALS — BP 128/82 | HR 94 | Ht 68.0 in | Wt 176.6 lb

## 2021-08-29 DIAGNOSIS — E0500 Thyrotoxicosis with diffuse goiter without thyrotoxic crisis or storm: Secondary | ICD-10-CM

## 2021-08-29 LAB — T4, FREE: Free T4: 0.7 ng/dL (ref 0.60–1.60)

## 2021-08-29 LAB — T3, FREE: T3, Free: 3.5 pg/mL (ref 2.3–4.2)

## 2021-08-29 LAB — TSH: TSH: 3.11 u[IU]/mL (ref 0.35–5.50)

## 2021-08-29 NOTE — Progress Notes (Signed)
Patient ID: Whitney Bowman, female   DOB: [birth year], 51 y.o.   MRN: [id number]   This visit occurred during the SARS-CoV-2 public health emergency.  Safety protocols were in place, including screening questions prior to the visit, additional usage of staff PPE, and extensive cleaning of exam room while observing appropriate contact time as indicated for disinfecting solutions.   HPI  Whitney Bowman is a 51 y.o.-year-old female, initially referred by her PCP, Dr. Lorelei Pont for evaluation for Graves' disease.  Last visit 3.5 months ago.  Previous visit 2 years and 10 months prior.  Interim history: At last visit, pt  was referred back to endocrinology after she started to experience a Graves' disease recurrence.  At that time, she was already back on methimazole 5 mg twice a day (however, she was taking it once a day she was forgetting the second dose). At this visit, she denies unintentional weight loss, tremors.  At last visit she had restlessness, insomnia, hot flashes, palpitations and more frequent stools, which improved after starting methimazole. She recently changed her job of 18 years >> less stress. She gained weight since last OV - she is frustrated about this.  Reviewed history: Pt's TFTs were found to be abnormal this Spring- during an APE. Retrospectively, she had tachycardia for 10 years after the birth of her second child >> palpitations.  She started taking a cleanse supplement with iodine, kelp, seaweed (list of ingredients - scanned) in mid-07/2016 >> taking it for 2 months when she had labs in 09/2016.  She stopped these right after the labs returned.  She remembers having had a URI  in 06/2017: Bronchitis/sinusitis.  We started methimazole 5 mg twice daily.    We were able to decrease the dose to 2.5 mg daily in 06/2017 and I advised her to stop the methimazole completely in 09/2017.  She read the message through my chart but did not stop the medication.     At our visit  from 06/2018, I advised her to stop methimazole completely, which she did.   However, in 02/2020, she had a mildly low TSH and a year later, in 02/2021 TSH was undetectable.  Methimazole was restarted then.  In 04/2021, I advised her to continue methimazole 5 mg daily.  Reviewed her TFTs: Lab Results  Component Value Date   TSH 2.23 06/27/2021   TSH 0.01 (L) 05/11/2021   TSH <0.01 (L) 04/17/2021   TSH <0.01 Repeated and verified X2. (L) 03/09/2021   TSH 0.32 (L) 03/07/2020   TSH 1.68 12/17/2018   TSH 2.00 09/02/2018   TSH 2.63 07/16/2018   TSH 2.09 01/24/2018   TSH 3.31 10/08/2017   FREET4 0.62 06/27/2021   FREET4 0.85 05/11/2021   FREET4 0.97 12/17/2018   FREET4 0.93 09/02/2018   FREET4 0.68 07/16/2018   FREET4 0.84 01/24/2018   FREET4 0.74 10/08/2017   FREET4 0.76 07/11/2017   FREET4 0.73 05/23/2017   FREET4 0.64 04/08/2017   T3FREE 3.4 06/27/2021   T3FREE 3.5 05/11/2021   T3FREE 3.6 04/17/2021   T3FREE 5.0 (H) 03/09/2021   T3FREE 3.6 03/07/2020   T3FREE 3.3 12/17/2018   T3FREE 3.6 09/02/2018   T3FREE 3.5 07/16/2018   T3FREE 3.3 01/24/2018   T3FREE 3.3 10/08/2017   Graves' antibodies were elevated: Lab Results  Component Value Date   TSI 251 (H) 05/11/2021   TSI <89 07/16/2018   TSI 241 (H) 12/21/2016   Her thyrotropin receptor antibodies were elevated: Component  Latest Ref Rng & Units 10/25/2016  Thyrotropin Receptor Ab     <=16.0 % 40.5 (H)    Pt denies: - feeling nodules in neck - hoarseness - dysphagia - choking - SOB with lying down  She has a history of palpitations (after her second pregnancy: Sees Dr. Radford Pax with cardiology every other year; had a Holter monitor), insomnia, fatigue, initially weight loss but she has gained weight afterwards, anxiety, excessive sweating and heat intolerance, mild occasional tremors.  The symptoms resolved on methimazole.  Pt does not have a FH of thyroid ds. No FH of thyroid cancer. No h/o radiation tx to head  or neck.  No recent steroids use. She has family history of RA.  She had DUB and was previously on OCPs but stopped due to weight gain.  However, since then, she had D&C in 06/2020.  ROS:  + see HPI  I reviewed pt's medications, allergies, PMH, social hx, family hx, and changes were documented in the history of present illness. Otherwise, unchanged from my initial visit note.  Past Medical History:  Diagnosis Date   DUB (dysfunctional uterine bleeding)    History of closed head injury    per pt age 68 fell and had closed skull fracture , stated brief loc by no coma , and no residuals   History of Graves' disease    endocrinologist--  dr Cruzita Lederer-- dx 2018,  was on medication until 2019 due to lab work normallized, felt it may have to have taking OTC seaweed with four times the iodine   History of positive PPD    per pt positive skin test and cxr was abnormal was medication for a full year approx. 1991   Hypertension    Intermittent palpitations    cardiologist--- dr t. turner--  normal ETT ,echo , and CTA normal   Wears glasses    Past Surgical History:  Procedure Laterality Date   DILATATION & CURRETTAGE/HYSTEROSCOPY WITH RESECTOCOPE N/A 07/19/2020   Procedure: DILATATION & CURETTAGE/HYSTEROSCOPY WITH RESECTOCOPE;  Surgeon: Servando Salina, MD;  Location: [place];  Service: Gynecology;  Laterality: N/A;   HYSTEROSCOPY WITH RESECTOSCOPE N/A 07/19/2020   Procedure: HYSTEROSCOPY WITH RESECTOSCOPE;  Surgeon: Servando Salina, MD;  Location: [place];  Service: Gynecology;  Laterality: N/A;   UMBILICAL HERNIA REPAIR  08-09-2009  @[facility]    Social History   Social History   Marital status: Married    Spouse name: N/A   Number of children: 2 sons   Occupational History   Programmer, 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 90 tablet 3   cyclobenzaprine (FLEXERIL) 5 MG tablet Take 5 mg by mouth as needed (muscle spasms/cramps).      meloxicam (MOBIC) 15 MG tablet Take 15 mg by mouth daily.     methimazole (TAPAZOLE) 5 MG tablet Take 1 tablet (5 mg total) by mouth 2 (two) times daily. 60 tablet 3   Multiple Vitamin (MULTIVITAMIN) tablet Take 1 tablet by mouth daily.     Probiotic Product (PROBIOTIC DAILY PO) Take by mouth daily as needed.  No current facility-administered medications on file prior to visit.   Allergies  Allergen Reactions   Cefdinir Diarrhea    REACTION: PROTRACTED DIARRHEA/ COLITIS   Shellfish Allergy Diarrhea and Nausea Only   Sudafed [Pseudoephedrine Hcl]     Tachycardia   Cephalosporins Diarrhea    severe   Codeine Nausea And Vomiting   Family History  Problem Relation Age of Onset   Hypertension Mother    Hypertension Father   Mother and father also had melanoma.  PE: BP 128/82 (BP Location: Right Arm, Patient Position: Sitting, Cuff Size: Normal)    Pulse 94    Ht 5' 8"  (1.727 m)    Wt 176 lb 9.6 oz (80.1 kg)    SpO2 97%    BMI 26.85 kg/m  Wt Readings from Last 3 Encounters:  08/29/21 176 lb 9.6 oz (80.1 kg)  05/11/21 170 lb (77.1 kg)  03/09/21 169 lb (76.7 kg)   Constitutional: slightly overweight, in NAD Eyes: PERRLA, EOMI, no exophthalmos ENT: moist mucous membranes, no thyromegaly, no cervical lymphadenopathy Cardiovascular: tachycardia, RR, No MRG Respiratory: CTA B Musculoskeletal: no deformities, strength intact in all 4 Skin: moist, warm, no rashes Neurological: no tremor with outstretched hands, DTR normal in all 4  ASSESSMENT: 1.Graves' disease -recurrent  PLAN:  1. Patient with history of Graves' disease, diagnosed in 2018, initially with mild  thyrotoxic symptoms: Mild weight loss, heat intolerance, palpitations, anxiety, tremors.  Her TSI antibodies and thyrotropin receptor antibodies returned positive.  We initially started her on methimazole 5 mg twice a day and we were then able to reduce the dose and even stopped completely in 06/2018.  She was lost to follow-up afterwards.  Her PCP contacted me due to undetectable TSH levels in 08 and 03/2021.  At that time, I advised her to restart methimazole. -At last visit she was on methimazole 5 mg twice a day, but was mostly taking it in the morning as she was forgetting the second dose 50% of the time. -She did mention that she started to feel better after starting methimazole, with less frequent stools and also sleeping better.  She still has palpitations, for which she was previously followed by Dr. Radford Pax (cardiology).  She is not on a beta-blocker.  At last visit, pulse was 98.  She felt that this was particularly increased after meals and after activity.  I did suggest a beta-blocker at that time but advised her to discuss with her cardiologist.  She is only seen Dr. Radford Pax on a biennial schedule and she did not see her since last visit.  However, today's visit, she tells me that her heart rate is usually around 85 at home per her apple watch. -At last visit, TFTs were in the thyrotoxic range and her TSI antibodies were elevated. I advised her to continue with 5 mg of methimazole daily. Subsequent TFTs improved and TSH was normal at last check in 05/2021. -At today's visit, we will repeat her TFTs and change the methimazole dose accordingly. -we reviewed possible treatments for Graves' disease including RAI ablation and thyroidectomy but also continuing methimazole low-dose.  For now, we will continue methimazole. -At this visit, she has no signs of Graves' ophthalmopathy: No blurry vision, double vision, eye pain, chemosis. -I will see the patient back in 6 months but possibly sooner for  labs  Component     Latest Ref Rng & Units 08/29/2021  TSH     0.35 - 5.50 uIU/mL 3.11  Triiodothyronine,Free,Serum  2.3 - 4.2 pg/mL 3.5  T4,Free(Direct)     0.60 - 1.60 ng/dL 0.70  Normal TFTs.  For now I would suggest to continue the same dose of methimazole and recheck the tests in 2-3 months.  Philemon Kingdom, MD PhD [facility] Endocrinology

## 2021-08-29 NOTE — Patient Instructions (Addendum)
Please continue Methimazole 5 mg 1x daily.  Please stop at the lab.  Please come back for a follow-up appointment in 6 months.

## 2021-09-18 ENCOUNTER — Other Ambulatory Visit: Payer: Self-pay | Admitting: Family Medicine

## 2021-09-18 ENCOUNTER — Encounter: Payer: Self-pay | Admitting: Internal Medicine

## 2021-09-18 DIAGNOSIS — E0500 Thyrotoxicosis with diffuse goiter without thyrotoxic crisis or storm: Secondary | ICD-10-CM

## 2021-09-18 MED ORDER — METHIMAZOLE 5 MG PO TABS: 5.0000 mg | ORAL_TABLET | Freq: Two times a day (BID) | ORAL | 1 refills | Status: DC

## 2021-11-07 ENCOUNTER — Other Ambulatory Visit (INDEPENDENT_AMBULATORY_CARE_PROVIDER_SITE_OTHER): Payer: 59

## 2021-11-07 DIAGNOSIS — E0500 Thyrotoxicosis with diffuse goiter without thyrotoxic crisis or storm: Secondary | ICD-10-CM | POA: Diagnosis not present

## 2021-11-07 LAB — TSH: TSH: 3.36 u[IU]/mL (ref 0.35–5.50)

## 2021-11-07 LAB — T4, FREE: Free T4: 0.8 ng/dL (ref 0.60–1.60)

## 2021-11-07 LAB — T3, FREE: T3, Free: 3.1 pg/mL (ref 2.3–4.2)

## 2021-11-09 ENCOUNTER — Encounter: Payer: Self-pay | Admitting: Internal Medicine

## 2021-11-17 ENCOUNTER — Encounter: Payer: Self-pay | Admitting: Internal Medicine

## 2022-02-13 ENCOUNTER — Encounter: Payer: Self-pay | Admitting: Cardiology

## 2022-02-13 ENCOUNTER — Ambulatory Visit: Payer: 59 | Admitting: Cardiology

## 2022-02-13 VITALS — BP 96/78 | HR 67 | Ht 68.0 in | Wt 149.0 lb

## 2022-02-13 DIAGNOSIS — I10 Essential (primary) hypertension: Secondary | ICD-10-CM

## 2022-02-13 DIAGNOSIS — Z8249 Family history of ischemic heart disease and other diseases of the circulatory system: Secondary | ICD-10-CM

## 2022-02-13 NOTE — Patient Instructions (Signed)
Medication Instructions:  Your physician recommends that you continue on your current medications as directed. Please refer to the Current Medication list given to you today.  *If you need a refill on your cardiac medications before your next appointment, please call your pharmacy*  Testing/Procedures: Your provider has recommended that you have a Gated Chest CT.    Follow-Up: At [facility], you and your health needs are our priority.  As part of our continuing mission to provide you with exceptional heart care, we have created designated Provider Care Teams.  These Care Teams include your primary Cardiologist (physician) and Advanced Practice Providers (APPs -  Physician Assistants and Nurse Practitioners) who all work together to provide you with the care you need, when you need it.  Your next appointment:   1 year(s)  The format for your next appointment:   In Person  Provider:   Armanda Magic, MD    Important Information About Sugar

## 2022-02-13 NOTE — Addendum Note (Signed)
Addended by: Theresia Majors on: 02/13/2022 08:57 AM   Modules accepted: Orders

## 2022-02-13 NOTE — Addendum Note (Signed)
Addended by: Theresia Majors on: 02/13/2022 08:43 AM   Modules accepted: Orders

## 2022-02-13 NOTE — Progress Notes (Signed)
Cardiology Office Note    Date:  02/13/2022   ID:  DWIGHT ROSEBROCK, DOB 1971-03-25, MRN [id number]  PCP:  Darreld Mclean, MD  Cardiologist:  Fransico Him, MD   Chief Complaint  Patient presents with   Follow-up    Hypertension and family history of aortic aneurysms    History of Present Illness:  Whitney Bowman is a 51 y.o. female with a hx of palpitations and sinus tachycardia who was first evaluated in 2010 and then again in 2015 for CP with normal ETT and echo.  She was referred back again for evaluation of HTN.  She was dx with Grave's disease and was placed on Methimazole but subsequently stopped.  She was seen by Endocrinology and was complaining of palpitations and was referred back for evaluation. She apparently was placed on OCP due to ovarian cysts and then developed elevated BP and her OCP was changed to a POP and then she was seen back by her PCP and BP improved.    Due to a fm hx of ? Aortic dissection in her mom who died in the OR,  the patient was concerned about the BP and saw me again.  She had a negative chest and abdominal CTA showing no evidence of dissection or aneurysm.  2D echo showed normal LVF.  She is here today for followup and is doing well.  She denies any chest pain or pressure, SOB, DOE, PND, orthopnea, LE edema, dizziness, palpitations or syncope. She is compliant with her meds and is tolerating meds with no SE.    Past Medical History:  Diagnosis Date   DUB (dysfunctional uterine bleeding)    History of closed head injury    per pt age 44 fell and had closed skull fracture , stated brief loc by no coma , and no residuals   History of Graves' disease    endocrinologist--  dr Cruzita Lederer-- dx 2018,  was on medication until 2019 due to lab work normallized, felt it may have to have taking OTC seaweed with four times the iodine   History of positive PPD    per pt positive skin test and cxr was abnormal was medication for a full year approx. 1991    Hypertension    Intermittent palpitations    cardiologist--- dr t. Nashla Althoff--  normal ETT ,echo , and CTA normal   Wears glasses     Past Surgical History:  Procedure Laterality Date   DILATATION & CURRETTAGE/HYSTEROSCOPY WITH RESECTOCOPE N/A 07/19/2020   Procedure: DILATATION & CURETTAGE/HYSTEROSCOPY WITH RESECTOCOPE;  Surgeon: Servando Salina, MD;  Location: [place];  Service: Gynecology;  Laterality: N/A;   HYSTEROSCOPY WITH RESECTOSCOPE N/A 07/19/2020   Procedure: HYSTEROSCOPY WITH RESECTOSCOPE;  Surgeon: Servando Salina, MD;  Location: [place];  Service: Gynecology;  Laterality: N/A;   UMBILICAL HERNIA REPAIR  08-09-2009  @[facility]     Current Medications: Current Meds  Medication Sig   amLODipine (NORVASC) 2.5 MG tablet Take 1 tablet (2.5 mg total) by mouth daily.   cyclobenzaprine (FLEXERIL) 5 MG tablet Take 5 mg by mouth as needed (muscle spasms/cramps).    methimazole (TAPAZOLE) 5 MG tablet Take 1 tablet (5 mg total) by mouth 2 (two) times daily. (Patient taking differently: Take 5 mg by mouth every other day.)   Multiple Vitamin (MULTIVITAMIN) tablet Take 1 tablet by mouth daily.    Allergies:   Cefdinir, Shellfish allergy, Sudafed [pseudoephedrine hcl], Cephalosporins, and Codeine   Social History  Socioeconomic History   Marital status: Married    Spouse name: Not on file   Number of children: Not on file   Years of education: Not on file   Highest education level: Not on file  Occupational History   Not on file  Tobacco Use   Smoking status: Never   Smokeless tobacco: Never  Vaping Use   Vaping Use: Never used  Substance and Sexual Activity   Alcohol use: Yes    Alcohol/week: 4.0 - 5.0 standard drinks of alcohol    Types: 4 - 5 Glasses of wine per week   Drug use: Never   Sexual activity: Not on file    Comment: husband had vasectomy  Other Topics Concern   Not on file  Social History Narrative   Not on file    Social Determinants of Health   Financial Resource Strain: Not on file  Food Insecurity: Not on file  Transportation Needs: Not on file  Physical Activity: Not on file  Stress: Not on file  Social Connections: Not on file     Family History:  The patient's family history includes Hypertension in her father and mother.   ROS:   Please see the history of present illness.    ROS All other systems reviewed and are negative.      No data to display           PHYSICAL EXAM:   VS:  BP 96/78   Pulse 67   Ht 5\' 8"  (1.727 m)   Wt 149 lb (67.6 kg)   BMI 22.66 kg/m     GEN: Well nourished, well developed in no acute distress HEENT: Normal NECK: No JVD; No carotid bruits LYMPHATICS: No lymphadenopathy CARDIAC:RRR, no murmurs, rubs, gallops RESPIRATORY:  Clear to auscultation without rales, wheezing or rhonchi  ABDOMEN: Soft, non-tender, non-distended MUSCULOSKELETAL:  No edema; No deformity  SKIN: Warm and dry NEUROLOGIC:  Alert and oriented x 3 PSYCHIATRIC:  Normal affect   Wt Readings from Last 3 Encounters:  02/13/22 149 lb (67.6 kg)  08/29/21 176 lb 9.6 oz (80.1 kg)  05/11/21 170 lb (77.1 kg)      Studies/Labs Reviewed:   EKG:  EKG is ordered today.  The ekg ordered today demonstrates NSR with no ST changes  2D echo 12/202 IMPRESSIONS    1. Left ventricular ejection fraction, by visual estimation, is 60 to  65%. The left ventricle has normal function. There is no left ventricular  hypertrophy.   2. Left ventricular diastolic parameters are indeterminate.   3. The left ventricle has no regional wall motion abnormalities.   4. Global right ventricle has normal systolic function.The right  ventricular size is normal. No increase in right ventricular wall  thickness.   5. Left atrial size was normal.   6. Right atrial size was normal.   7. The mitral valve is normal in structure. Trivial mitral valve  regurgitation. No evidence of mitral stenosis.   8. The  tricuspid valve is normal in structure. Tricuspid valve  regurgitation is mild.   9. The aortic valve is normal in structure. Aortic valve regurgitation is  not visualized. No evidence of aortic valve sclerosis or stenosis.  10. The pulmonic valve was normal in structure. Pulmonic valve  regurgitation is not visualized.  11. Mildly elevated pulmonary artery systolic pressure.  12. The inferior vena cava is normal in size with greater than 50%  respiratory variability, suggesting right atrial pressure of 3 mmHg.  Recent Labs: 03/09/2021: ALT 11; BUN 14; Creatinine, Ser 0.64; Hemoglobin 12.8; Platelets 262.0; Potassium 4.8; Sodium 138 11/07/2021: TSH 3.36   Lipid Panel    Component Value Date/Time   CHOL 201 (H) 03/09/2021 1057   TRIG 148.0 03/09/2021 1057   HDL 76.10 03/09/2021 1057   CHOLHDL 3 03/09/2021 1057   VLDL 29.6 03/09/2021 1057   LDLCALC 95 03/09/2021 1057    Additional studies/ records that were reviewed today include:  Chest and abdominal CTA, 2D echo    ASSESSMENT:    1. Family history of aortic dissection   2. Essential hypertension      PLAN:  In order of problems listed above:  1.  Hypertension  -BP is adequately controlled on exam today and actually soft but usually during the day it averages 120/32mmHg -Continue prescription drug management with amlodipine 2.5 mg daily with as needed refills  2.   Family hx of aortic disease  -her mother died of aortic dissection and also had an iliac artery aneurysm and also had HOCM.   -Her moms father had a descending aortic aneurysm and iliac aneurysms.  -screening Chest and abdominal CTA showed no aneurysm.  -she was referred for genetic counseling and it was recommended that echo and CT be done and if normal then no further genetic testing would be recommended  -Chest CTA in 2020 showed no evidence of thoracic/aortoiliac aneurysm dissection or other findings. -We will repeat gated chest CTA -she will see me  back yearly to make sure her BP is adequately controlled    Medication Adjustments/Labs and Tests Ordered: Current medicines are reviewed at length with the patient today.  Concerns regarding medicines are outlined above.  Medication changes, Labs and Tests ordered today are listed in the Patient Instructions below.  There are no Patient Instructions on file for this visit.    Signed, Armanda Magic, MD  02/13/2022 8:32 AM    [facility] [address] [place], [place], [place]  [address] Phone: [phone]; Fax: [phone]

## 2022-02-14 LAB — BASIC METABOLIC PANEL
BUN/Creatinine Ratio: 19 (ref 9–23)
BUN: 13 mg/dL (ref 6–24)
CO2: 24 mmol/L (ref 20–29)
Calcium: 9.3 mg/dL (ref 8.7–10.2)
Chloride: 99 mmol/L (ref 96–106)
Creatinine, Ser: 0.7 mg/dL (ref 0.57–1.00)
Glucose: 90 mg/dL (ref 70–99)
Potassium: 4.8 mmol/L (ref 3.5–5.2)
Sodium: 136 mmol/L (ref 134–144)
eGFR: 105 mL/min/{1.73_m2} (ref >59)

## 2022-02-16 ENCOUNTER — Encounter: Payer: Self-pay | Admitting: Cardiology

## 2022-02-27 ENCOUNTER — Ambulatory Visit: Payer: 59 | Admitting: Internal Medicine

## 2022-02-27 ENCOUNTER — Encounter: Payer: Self-pay | Admitting: Internal Medicine

## 2022-02-27 ENCOUNTER — Telehealth: Payer: Self-pay | Admitting: Cardiology

## 2022-02-27 VITALS — BP 100/78 | HR 63 | Ht 68.0 in | Wt 150.8 lb

## 2022-02-27 DIAGNOSIS — Z8249 Family history of ischemic heart disease and other diseases of the circulatory system: Secondary | ICD-10-CM

## 2022-02-27 DIAGNOSIS — E0500 Thyrotoxicosis with diffuse goiter without thyrotoxic crisis or storm: Secondary | ICD-10-CM

## 2022-02-27 LAB — T4, FREE: Free T4: 0.73 ng/dL (ref 0.60–1.60)

## 2022-02-27 LAB — TSH: TSH: 1.65 u[IU]/mL (ref 0.35–5.50)

## 2022-02-27 LAB — T3, FREE: T3, Free: 2.8 pg/mL (ref 2.3–4.2)

## 2022-02-27 NOTE — Progress Notes (Signed)
Patient ID: Whitney Bowman, female   DOB: Apr 08, 1971, 51 y.o.   MRN: [id number]   HPI  Whitney Bowman is a 51 y.o.-year-old female, initially referred by her PCP, Dr. Lorelei Pont for evaluation for Graves' disease.  Last visit 6 months ago.  Interim history: At today's visit, she has no tremors, insomnia, hot flashes, palpitations, and hyperdefecation.  She previously had these but they improved after starting methimazole. Before last visit, she changed her job of 18 years >> less stress.  She is traveling more for work now. However, she lost 26 pounds since last visit - started Noom 08/2021.  She is avoiding processed foods and high calorie density foods. She was advised by cardiology to stop Amlodipine.  Reviewed history: Pt's TFTs were found to be abnormal this Spring- during an APE. Retrospectively, she had tachycardia for 10 years after the birth of her second child >> palpitations.  She started taking a cleanse supplement with iodine, kelp, seaweed (list of ingredients - scanned) in mid-07/2016 >> taking it for 2 months when she had labs in 09/2016.  She stopped these right after the labs returned.  She remembers having had a URI  in 06/2017: Bronchitis/sinusitis.  We started methimazole 5 mg twice daily.    We were able to decrease the dose to 2.5 mg daily in 06/2017 and I advised her to stop the methimazole completely in 09/2017.  She read the message through my chart but did not stop the medication.     At our visit from 06/2018, I advised her to stop methimazole completely, which she did.   However, in 02/2020, she had a mildly low TSH and a year later, in 02/2021 TSH was undetectable.  Methimazole was restarted then.  In 04/2021, I advised her to continue methimazole 5 mg daily.  In 10/2021, I advised her to decrease methimazole to 5 mg every other day.  She did not return for labs as advised afterwards.  Reviewed her TFTs: Lab Results  Component Value Date   TSH 3.36  11/07/2021   TSH 3.11 08/29/2021   TSH 2.23 06/27/2021   TSH 0.01 (L) 05/11/2021   TSH <0.01 (L) 04/17/2021   TSH <0.01 Repeated and verified X2. (L) 03/09/2021   TSH 0.32 (L) 03/07/2020   TSH 1.68 12/17/2018   TSH 2.00 09/02/2018   TSH 2.63 07/16/2018   FREET4 0.80 11/07/2021   FREET4 0.70 08/29/2021   FREET4 0.62 06/27/2021   FREET4 0.85 05/11/2021   FREET4 0.97 12/17/2018   FREET4 0.93 09/02/2018   FREET4 0.68 07/16/2018   FREET4 0.84 01/24/2018   FREET4 0.74 10/08/2017   FREET4 0.76 07/11/2017   T3FREE 3.1 11/07/2021   T3FREE 3.5 08/29/2021   T3FREE 3.4 06/27/2021   T3FREE 3.5 05/11/2021   T3FREE 3.6 04/17/2021   T3FREE 5.0 (H) 03/09/2021   T3FREE 3.6 03/07/2020   T3FREE 3.3 12/17/2018   T3FREE 3.6 09/02/2018   T3FREE 3.5 07/16/2018   Graves' antibodies were elevated: Lab Results  Component Value Date   TSI 251 (H) 05/11/2021   TSI <89 07/16/2018   TSI 241 (H) 12/21/2016   Her thyrotropin receptor antibodies were elevated: Component     Latest Ref Rng & Units 10/25/2016  Thyrotropin Receptor Ab     <=16.0 % 40.5 (H)    Pt denies: - feeling nodules in neck - hoarseness - dysphagia - choking She has a history of palpitations (after her second pregnancy: Sees Dr. Radford Pax with cardiology every other year;  had a Holter monitor), insomnia, fatigue, initially weight loss but she has gained weight afterwards, anxiety, excessive sweating and heat intolerance, mild occasional tremors.  The symptoms resolved on methimazole.  Pt does not have a FH of thyroid ds. No FH of thyroid cancer. No h/o radiation tx to head or neck. No recent steroids use. She has family history of RA.  She had DUB and was previously on OCPs but stopped due to weight gain.  However, afterwards, she had D&C in 06/2020.  ROS:  + see HPI  I reviewed pt's medications, allergies, PMH, social hx, family hx, and changes were documented in the history of present illness. Otherwise, unchanged from my  initial visit note.  Past Medical History:  Diagnosis Date   DUB (dysfunctional uterine bleeding)    History of closed head injury    per pt age 35 fell and had closed skull fracture , stated brief loc by no coma , and no residuals   History of Graves' disease    endocrinologist--  dr Elvera Lennox-- dx 2018,  was on medication until 2019 due to lab work normallized, felt it may have to have taking OTC seaweed with four times the iodine   History of positive PPD    per pt positive skin test and cxr was abnormal was medication for a full year approx. 1991   Hypertension    Intermittent palpitations    cardiologist--- dr t. turner--  normal ETT ,echo , and CTA normal   Wears glasses    Past Surgical History:  Procedure Laterality Date   DILATATION & CURRETTAGE/HYSTEROSCOPY WITH RESECTOCOPE N/A 07/19/2020   Procedure: DILATATION & CURETTAGE/HYSTEROSCOPY WITH RESECTOCOPE;  Surgeon: Maxie Better, MD;  Location: [facility];  Service: Gynecology;  Laterality: N/A;   HYSTEROSCOPY WITH RESECTOSCOPE N/A 07/19/2020   Procedure: HYSTEROSCOPY WITH RESECTOSCOPE;  Surgeon: Maxie Better, MD;  Location: [facility];  Service: Gynecology;  Laterality: N/A;   UMBILICAL HERNIA REPAIR  08-09-2009  @[email or web address]    Social History   Social History   Marital status: Married    Spouse name: N/A   Number of children: 2 sons   Occupational History   - travels a lot for work   Social History Main Topics   Smoking status: Never Smoker   Smokeless tobacco: Never Used   Alcohol use Yes     Comment: Wine, 3 times a week    Drug use: No   Current Outpatient Medications on File Prior to Visit  Medication Sig Dispense Refill   amLODipine (NORVASC) 2.5 MG tablet Take 1 tablet (2.5 mg total) by mouth daily. 90 tablet 3   cyclobenzaprine (FLEXERIL) 5 MG tablet Take 5 mg by mouth as needed (muscle spasms/cramps).      methimazole (TAPAZOLE) 5 MG tablet Take 1 tablet (5 mg  total) by mouth 2 (two) times daily. (Patient taking differently: Take 5 mg by mouth every other day.) 180 tablet 1   Multiple Vitamin (MULTIVITAMIN) tablet Take 1 tablet by mouth daily.     No current facility-administered medications on file prior to visit.   Allergies  Allergen Reactions   Cefdinir Diarrhea    REACTION: PROTRACTED DIARRHEA/ COLITIS   Shellfish Allergy Diarrhea and Nausea Only   Sudafed [Pseudoephedrine Hcl]     Tachycardia   Cephalosporins Diarrhea    severe   Codeine Nausea And Vomiting   Family History  Problem Relation Age of Onset   Hypertension Mother    Hypertension Father  Mother and father also had melanoma.  PE: BP 100/78 (BP Location: Left Arm, Patient Position: Sitting, Cuff Size: Normal)   Pulse 63   Ht 5\' 8"  (1.727 m)   Wt 150 lb 12.8 oz (68.4 kg)   SpO2 99%   BMI 22.93 kg/m  Wt Readings from Last 3 Encounters:  02/27/22 150 lb 12.8 oz (68.4 kg)  02/13/22 149 lb (67.6 kg)  08/29/21 176 lb 9.6 oz (80.1 kg)   Constitutional: normal weight, in NAD Eyes: EOMI, no exophthalmos ENT: moist mucous membranes, no thyromegaly, no cervical lymphadenopathy Cardiovascular: RRR, No MRG Respiratory: CTA B Musculoskeletal: no deformities Skin: moist, warm, no rashes Neurological: no tremor with outstretched hands, DTR normal in all 4  ASSESSMENT: 1.Graves' disease -recurrent  PLAN:  1. Patient with history of Graves' disease, diagnosed in 2018, initially with mild thyrotoxic symptoms: Mild weight loss, heat intolerance, palpitations, anxiety, tremors.  Her TSI antibodies and thyrotropin receptor antibodies returned positive.  We initially started her on methimazole 5 mg twice a day and we were then able to reduce the dose and finally stop in 06/2018.  She was lost for follow-up afterwards.  Her PCP contacted me due to undetectable TSH levels in 08 in 03/2021.  At that time, I advised her to restart methimazole 5 mg 2x a day.  However, when she returns  to see me she was forgetting the second dose of the day approximately 50% of the time.  She did feel better after starting methimazole with less frequent stools and also sleeping better.  She still had palpitations for which she previously saw Dr. 04/2021 (cardiology).  At that time, since her pulse was over 99, I suggested to start a beta-blocker but to discuss with her cardiologist first.  She ended up not starting the beta-blocker as she mentioned that her heart rate was usually 85 at home per her Apple Watch.  We then continued with 5 mg of methimazole and TFTs normalized in 05/2021. -At last visit, we reviewed possible treatments for Graves' disease to include RAI ablation and thyroidectomy but also continuing methimazole low-dose.  However, in 10/2021, her TSH was higher in the normal range so I advised her to decrease methimazole to every other day.  Unfortunately, she did not come back for labs ~5 weeks after this change -At today's visit, she had a weight loss of 26 pounds since last visit!  However, upon questioning, this was unintentional weight loss, after she started Noom 6 months ago.  At today's visit, her pulse is normal and she does not describe any thyrotoxic symptoms. -No signs of Graves' ophthalmopathy: Blurry vision, double vision, eye pain, chemosis -We will recheck her TFTs today.  We discussed that if we change her methimazole dose or if the tests are abnormal, she will need to return for labs in approximately 5 weeks.   -I will see her back in 6 months, but sooner for labs.  - Total time spent for the visit: 25 min,  in precharting, obtaining medical information from the chart and from the patient, reviewing her  previous labs, evaluations, and treatments, reviewing her symptoms, counseling her about her thyroid condition condition (please see the discussed topics above), and developing a plan to further investigate and treat it.  We also discussed about her recent weight  loss.  Component     Latest Ref Rng 02/27/2022  T4,Free(Direct)     0.60 - 1.60 ng/dL 04/29/2022   TSH     3.22 -  5.50 uIU/mL 1.65   Triiodothyronine,Free,Serum     2.3 - 4.2 pg/mL 2.8    TFTs are excellent.  For now, we will continue the same regimen.  Philemon Kingdom, MD PhD [facility]

## 2022-02-27 NOTE — Telephone Encounter (Signed)
Patient called concerned that her CT ANGIO CHEST AORTA W/ & OR WO/CM & GATING ([place] ONLY) test is only being done at the hospital.  Patient stated that her insurance will only cover the cost if the procedure is done outside of a hospital and she would like to get an alternative location.

## 2022-02-27 NOTE — Patient Instructions (Signed)
Please continue Methimazole 5 mg every other day.  Please stop at the lab.  You should have an endocrinology follow-up appointment in 6 months.  

## 2022-02-27 NOTE — Telephone Encounter (Signed)
Spoke with the patient who states that she had to cancel her CT scan tomorrow because it was going to cost her $700. She states that she called her insurance company and they told her this was because the test is being done at the hospital. Patient would like to know if there is another scan that she can get done elsewhere. Will send to Dr. Mayford Knife for advisement on whether CT scan needs to be gated or we can get a non-gated scan that would not have to be done at the hospital.

## 2022-02-28 ENCOUNTER — Ambulatory Visit (HOSPITAL_COMMUNITY): Payer: 59

## 2022-02-28 ENCOUNTER — Encounter (HOSPITAL_COMMUNITY): Payer: Self-pay

## 2022-03-02 NOTE — Telephone Encounter (Signed)
Left message for patient advising her that orders have been put in for her to have non-gated CTA which can be done at [facility]. Advised that schedulers would call her to set it up but to call back with any questions or concerns.

## 2022-03-06 ENCOUNTER — Encounter: Payer: Self-pay | Admitting: Cardiology

## 2022-04-03 ENCOUNTER — Ambulatory Visit
Admission: RE | Admit: 2022-04-03 | Discharge: 2022-04-03 | Disposition: A | Discharge status: Home / Self Care | Payer: 59 | Source: Ambulatory Visit | Attending: Cardiology | Admitting: Cardiology

## 2022-04-03 ENCOUNTER — Encounter: Payer: Self-pay | Admitting: Family Medicine

## 2022-04-03 DIAGNOSIS — Z8249 Family history of ischemic heart disease and other diseases of the circulatory system: Secondary | ICD-10-CM

## 2022-04-03 MED ORDER — IOPAMIDOL (ISOVUE-370) INJECTION 76%
75.0000 mL | Freq: Once | INTRAVENOUS | Status: AC | PRN
  Administered 2022-04-03: 75 mL via INTRAVENOUS

## 2022-04-04 NOTE — Progress Notes (Signed)
[facility] [address] [place], [place] [address] [phone]  Date:  04/05/2022   Name:  Whitney Bowman   DOB:  12-05-1970   MRN:  [id number]  PCP:  Whitney Cables, MD    Chief Complaint: ear discomfort (R ear fullness and ringing that started over the weekend. She did fly on last Monday. )   History of Present Illness:  Whitney Bowman is a 51 y.o. very pleasant female patient who presents with the following:  Patient seen today with concern of her ears ringing and seeming clogged Most recent visit with myself was about 1 year ago She does have history of Graves' disease, she is followed by endocrinology-most recent visit last month  She is also monitored by cardiology, visit with Dr. Mayford Knife in July  Last week she flew to [place]-her away flight was on a Monday- she felt fine. On Thursday am she awoke with her right ear feeling clogged and buzzing/ringing in the ear. She flew home on Friday and she did notice any change in her ears during or after the flight Not painful She just feels off balance but does not admit to true vertigo Her symptoms seem to have improved the last 1-2 days; she estimates she is about 50% better now She can hear but there is ringing The left ear is ok She is otherwise feeling well -no other neurologic symptoms, no headache, no symptoms of illness, no injury or trauma to her head or ear that she is aware of Her tinnitus is not pulsatile  Patient Active Problem List   Diagnosis Date Noted   Essential hypertension 03/03/2020   Lateral epicondylitis of right elbow 06/18/2017   Graves disease 12/03/2016   Chest pain 11/13/2013   CAP (community acquired pneumonia) 08/22/2012   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 1991   Hypertension    Intermittent palpitations    cardiologist--- dr t. turner--  normal ETT ,echo , and CTA normal   Wears glasses     Past Surgical History:  Procedure Laterality Date   DILATATION & CURRETTAGE/HYSTEROSCOPY WITH RESECTOCOPE N/A 07/19/2020   Procedure: DILATATION & CURETTAGE/HYSTEROSCOPY WITH RESECTOCOPE;  Surgeon: Maxie Better, MD;  Location: [facility];  Service: Gynecology;  Laterality: N/A;   HYSTEROSCOPY WITH RESECTOSCOPE N/A 07/19/2020   Procedure: HYSTEROSCOPY WITH RESECTOSCOPE;  Surgeon: Maxie Better, MD;  Location: [facility];  Service: Gynecology;  Laterality: N/A;   UMBILICAL HERNIA REPAIR  08-09-2009  @[email or web address]     Social History   Tobacco Use   Smoking status: Never   Smokeless tobacco: Never  Vaping Use   Vaping Use: Never used  Substance Use Topics   Alcohol use: Yes    Alcohol/week: 4.0 - 5.0 standard drinks of alcohol  Types: 4 - 5 Glasses of wine per week   Drug use: Never    Family History  Problem Relation Age of Onset   Hypertension Mother    Hypertension Father     Allergies  Allergen Reactions   Cefdinir Diarrhea    REACTION: PROTRACTED DIARRHEA/ COLITIS   Shellfish Allergy Diarrhea and Nausea Only   Sudafed [Pseudoephedrine Hcl]     Tachycardia   Cephalosporins Diarrhea    severe   Codeine Nausea And Vomiting    Medication list has been reviewed and updated.  Current Outpatient Medications on File Prior to Visit  Medication Sig Dispense Refill   cyclobenzaprine (FLEXERIL) 5 MG tablet Take 5 mg by mouth  as needed (muscle spasms/cramps).      methimazole (TAPAZOLE) 5 MG tablet Take 1 tablet (5 mg total) by mouth 2 (two) times daily. (Patient taking differently: Take 5 mg by mouth every other day.) 180 tablet 1   Multiple Vitamin (MULTIVITAMIN) tablet Take 1 tablet by mouth daily.     No current facility-administered medications on file prior to visit.    Review of Systems:  As per HPI- otherwise negative.   Physical Examination: Vitals:   04/05/22 0925  BP: 110/72  Pulse: 68  Resp: 18  Temp: 98 F (36.7 C)  SpO2: 96%   Vitals:   04/05/22 0925  Weight: 145 lb 6.4 oz (66 kg)  Height: 5\' 8"  (1.727 m)   Body mass index is 22.11 kg/m. Ideal Body Weight: Weight in (lb) to have BMI = 25: 164.1  GEN: no acute distress. Normal weight, looks well  HEENT: Atraumatic, Normocephalic.  Bilateral TM wnl, oropharynx normal.  PEERL,EOMI.   Ears and Nose: No external deformity. CV: RRR, No M/G/R. No JVD. No thrill. No extra heart sounds. PULM: CTA B, no wheezes, crackles, rhonchi. No retractions. No resp. distress. No accessory muscle use. ABD: S, NT, ND, +BS. No rebound. No HSM. EXTR: No c/c/e PSYCH: Normally interactive. Conversant.  Neuro exam wnl including strength, sensation, deep tendon reflex of all extremities, cranial nerves are normal except for hearing loss in right ear, negative Romberg, normal facial strength and sensation  Wt Readings from Last 3 Encounters:  04/05/22 145 lb 6.4 oz (66 kg)  02/27/22 150 lb 12.8 oz (68.4 kg)  02/13/22 149 lb (67.6 kg)   She enjoys walking for exercise  Assessment and Plan: Sensorineural hearing loss (SNHL) of right ear with unrestricted hearing of left ear - Plan: predniSONE (DELTASONE) 20 MG tablet  Patient seen today with likely idiopathic or viral sensorineural hearing loss of the right ear.  Discussed this with patient in detail.  It is reassuring that her symptoms have already improved quite a bit spontaneously.  We decided to have  her start on 60 mg of prednisone daily for 10 days, however if she cannot tolerate she can decrease to 40 mg and let me know.  We discussed having her see ear nose and throat and/or obtaining an MRI of her head.  For the time being she would like to see how she does over the next few days.  However, if not noticing significant improvement by Monday we will move forward with further steps  Signed Sunday, MD

## 2022-04-05 ENCOUNTER — Other Ambulatory Visit: Payer: Self-pay | Admitting: Obstetrics and Gynecology

## 2022-04-05 ENCOUNTER — Ambulatory Visit: Payer: 59 | Admitting: Family Medicine

## 2022-04-05 VITALS — BP 110/72 | HR 68 | Temp 98.0°F | Resp 18 | Ht 68.0 in | Wt 145.4 lb

## 2022-04-05 DIAGNOSIS — Z1231 Encounter for screening mammogram for malignant neoplasm of breast: Secondary | ICD-10-CM

## 2022-04-05 DIAGNOSIS — H9041 Sensorineural hearing loss, unilateral, right ear, with unrestricted hearing on the contralateral side: Secondary | ICD-10-CM | POA: Diagnosis not present

## 2022-04-05 MED ORDER — PREDNISONE 20 MG PO TABS: ORAL_TABLET | ORAL | 0 refills | Status: DC

## 2022-04-05 NOTE — Patient Instructions (Signed)
Good to see you today!  Please let me know how your hearing/ ear symptoms do over the next several days.  If not completely resolved in the next few days we will want to refer you to ENT  Take prednisone 60 mg daily for 10 days.  If excessive side effects ok to decrease to 40 mg

## 2022-04-08 ENCOUNTER — Encounter: Payer: Self-pay | Admitting: Family Medicine

## 2022-04-08 DIAGNOSIS — H9041 Sensorineural hearing loss, unilateral, right ear, with unrestricted hearing on the contralateral side: Secondary | ICD-10-CM

## 2022-05-14 ENCOUNTER — Encounter: Payer: Self-pay | Admitting: Family Medicine

## 2022-06-04 ENCOUNTER — Ambulatory Visit
Admission: RE | Admit: 2022-06-04 | Discharge: 2022-06-04 | Disposition: A | Discharge status: Home / Self Care | Payer: 59 | Source: Ambulatory Visit | Attending: Obstetrics and Gynecology | Admitting: Obstetrics and Gynecology

## 2022-06-04 DIAGNOSIS — Z1231 Encounter for screening mammogram for malignant neoplasm of breast: Secondary | ICD-10-CM

## 2022-07-06 ENCOUNTER — Ambulatory Visit: Payer: 59

## 2022-07-13 ENCOUNTER — Ambulatory Visit (INDEPENDENT_AMBULATORY_CARE_PROVIDER_SITE_OTHER): Payer: 59 | Admitting: *Deleted

## 2022-07-13 DIAGNOSIS — Z23 Encounter for immunization: Secondary | ICD-10-CM

## 2022-07-13 NOTE — Progress Notes (Signed)
Patient here for flu vaccine.  Vaccine given in left deltoid and patient tolerated well. 

## 2022-08-31 ENCOUNTER — Ambulatory Visit: Payer: 59 | Admitting: Internal Medicine

## 2022-08-31 ENCOUNTER — Encounter: Payer: Self-pay | Admitting: Internal Medicine

## 2022-08-31 VITALS — BP 122/80 | HR 68 | Ht 68.0 in | Wt 138.6 lb

## 2022-08-31 DIAGNOSIS — E0500 Thyrotoxicosis with diffuse goiter without thyrotoxic crisis or storm: Secondary | ICD-10-CM

## 2022-08-31 LAB — T3, FREE: T3, Free: 3 pg/mL (ref 2.3–4.2)

## 2022-08-31 LAB — T4, FREE: Free T4: 0.74 ng/dL (ref 0.60–1.60)

## 2022-08-31 LAB — TSH: TSH: 1.61 u[IU]/mL (ref 0.35–5.50)

## 2022-08-31 NOTE — Patient Instructions (Signed)
Please continue Methimazole 5 mg every other day.  Please stop at the lab.  You should have an endocrinology follow-up appointment in 6 months.

## 2022-08-31 NOTE — Progress Notes (Unsigned)
Patient ID: Whitney Bowman, female   DOB: Jul 20, 1971, 52 y.o.   MRN: [id number]   HPI  Whitney Bowman is a 52 y.o.-year-old female, initially referred by her PCP, Dr. Lorelei Pont for evaluation for Graves' disease.  Last visit 6 months ago.  Interim history: No tremors, insomnia, palpitations, and hyperdefecation.   She has occasional hot flushes at night. She continues to travel for work - not in last 2 mo. Before last visit, she lost 26 pounds since last visit - started Noom 08/2021.  She is still avoiding processed foods and high calorie density foods. She had a recent ear tube  - after pain during flights. Had transient hearing loss. Had a steroid taper for this.  Reviewed history: Pt's TFTs were found to be abnormal this Spring- during an APE. Retrospectively, she had tachycardia for 10 years after the birth of her second child >> palpitations.  She started taking a cleanse supplement with iodine, kelp, seaweed (list of ingredients - scanned) in mid-07/2016 >> taking it for 2 months when she had labs in 09/2016.  She stopped these right after the labs returned.  She remembers having had a URI  in 06/2017: Bronchitis/sinusitis.  We started methimazole 5 mg twice daily.    We were able to decrease the dose to 2.5 mg daily in 06/2017 and I advised her to stop the methimazole completely in 09/2017.  She read the message through my chart but did not stop the medication.     At our visit from 06/2018, I advised her to stop methimazole completely, which she did.   However, in 02/2020, she had a mildly low TSH and a year later, in 02/2021 TSH was undetectable.  Methimazole was restarted then.  In 04/2021, I advised her to continue methimazole 5 mg daily.  In 10/2021, I advised her to decrease methimazole to 5 mg every other day.    Reviewed her TFTs: Lab Results  Component Value Date   TSH 1.65 02/27/2022   TSH 3.36 11/07/2021   TSH 3.11 08/29/2021   TSH 2.23 06/27/2021   TSH 0.01 (L)  05/11/2021   TSH <0.01 (L) 04/17/2021   TSH <0.01 Repeated and verified X2. (L) 03/09/2021   TSH 0.32 (L) 03/07/2020   TSH 1.68 12/17/2018   TSH 2.00 09/02/2018   FREET4 0.73 02/27/2022   FREET4 0.80 11/07/2021   FREET4 0.70 08/29/2021   FREET4 0.62 06/27/2021   FREET4 0.85 05/11/2021   FREET4 0.97 12/17/2018   FREET4 0.93 09/02/2018   FREET4 0.68 07/16/2018   FREET4 0.84 01/24/2018   FREET4 0.74 10/08/2017   T3FREE 2.8 02/27/2022   T3FREE 3.1 11/07/2021   T3FREE 3.5 08/29/2021   T3FREE 3.4 06/27/2021   T3FREE 3.5 05/11/2021   T3FREE 3.6 04/17/2021   T3FREE 5.0 (H) 03/09/2021   T3FREE 3.6 03/07/2020   T3FREE 3.3 12/17/2018   T3FREE 3.6 09/02/2018   Graves' antibodies were elevated: Lab Results  Component Value Date   TSI 251 (H) 05/11/2021   TSI <89 07/16/2018   TSI 241 (H) 12/21/2016   Her thyrotropin receptor antibodies were elevated: Component     Latest Ref Rng & Units 10/25/2016  Thyrotropin Receptor Ab     <=16.0 % 40.5 (H)    Pt denies: - feeling nodules in neck - hoarseness - dysphagia - choking She has a history of palpitations (after her second pregnancy: Sees Dr. Radford Pax with cardiology every other year; had a Holter monitor), insomnia, fatigue, initially weight loss  but she has gained weight afterwards, anxiety, excessive sweating and heat intolerance, mild occasional tremors.  The symptoms resolved on methimazole.  Pt does not have a FH of thyroid ds. No FH of thyroid cancer. No h/o radiation tx to head or neck. No recent steroids use. She has family history of RA.  She had DUB and was previously on OCPs but stopped due to weight gain.  However, afterwards, she had D&C in 06/2020.  ROS:  + see HPI  I reviewed pt's medications, allergies, PMH, social hx, family hx, and changes were documented in the history of present illness. Otherwise, unchanged from my initial visit note.  Past Medical History:  Diagnosis Date   DUB (dysfunctional uterine  bleeding)    History of closed head injury    per pt age 70 fell and had closed skull fracture , stated brief loc by no coma , and no residuals   History of Graves' disease    endocrinologist--  dr Cruzita Lederer-- dx 2018,  was on medication until 2019 due to lab work normallized, felt it may have to have taking OTC seaweed with four times the iodine   History of positive PPD    per pt positive skin test and cxr was abnormal was medication for a full year approx. 1991   Hypertension    Intermittent palpitations    cardiologist--- dr t. turner--  normal ETT ,echo , and CTA normal   Wears glasses    Past Surgical History:  Procedure Laterality Date   DILATATION & CURRETTAGE/HYSTEROSCOPY WITH RESECTOCOPE N/A 07/19/2020   Procedure: DILATATION & CURETTAGE/HYSTEROSCOPY WITH RESECTOCOPE;  Surgeon: Servando Salina, MD;  Location: [place];  Service: Gynecology;  Laterality: N/A;   HYSTEROSCOPY WITH RESECTOSCOPE N/A 07/19/2020   Procedure: HYSTEROSCOPY WITH RESECTOSCOPE;  Surgeon: Servando Salina, MD;  Location: [place];  Service: Gynecology;  Laterality: N/A;   UMBILICAL HERNIA REPAIR  08-09-2009  @[email or web address]    Social History   Social History   Marital status: Married    Spouse name: N/A   Number of children: 2 sons   Occupational History   Programmer, 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      methimazole (TAPAZOLE) 5 MG tablet Take 1 tablet (5 mg total) by mouth 2 (two) times daily. (Patient taking differently: Take 5 mg by mouth every other day.) 180 tablet 1   Multiple Vitamin (MULTIVITAMIN) tablet Take 1 tablet by mouth  daily.     predniSONE (DELTASONE) 20 MG tablet Take 60 mg once daily for 10 days 30 tablet 0   No current facility-administered medications on file prior to visit.   Allergies  Allergen Reactions   Cefdinir Diarrhea    REACTION: PROTRACTED DIARRHEA/ COLITIS   Shellfish Allergy Diarrhea and Nausea Only   Sudafed [Pseudoephedrine Hcl]     Tachycardia   Cephalosporins Diarrhea    severe   Codeine Nausea And Vomiting   Family History  Problem Relation Age of Onset   Hypertension Mother    Hypertension Father   Mother and father also had melanoma.  PE:  BP 122/80 (BP Location: Left Arm, Patient Position: Sitting, Cuff Size: Normal)   Pulse 68   Ht 5\' 8"  (1.727 m)   Wt 138 lb 9.6 oz (62.9 kg)   SpO2 95%   BMI 21.07 kg/m  Wt Readings from Last 3 Encounters:  08/31/22 138 lb 9.6 oz (62.9 kg)  04/05/22 145 lb 6.4 oz (66 kg)  02/27/22 150 lb 12.8 oz (68.4 kg)   Constitutional: normal weight, in NAD Eyes:  EOMI, no exophthalmos ENT: no neck masses, no cervical lymphadenopathy Cardiovascular: RRR, No MRG Respiratory: CTA B Musculoskeletal: no deformities Skin:no rashes Neurological: no tremor with outstretched hands  ASSESSMENT: 1.Graves' disease -recurrent  PLAN:  1. Patient with history of Graves' disease, diagnosed in 2018, initially with mild thyrotoxic symptoms: Some weight loss, heat intolerance, palpitations, anxiety, tremors.  Her TSI antibodies and thyrotropin receptor antibodies were positive.  We initially started her on methimazole 5 mg twice a day and we were able to reduce the dose initially, and then stopped in 06/2018.  She was lost to follow-up afterwards.  Her PCP contacted me due to undetectable TSH levels in 08 and 03/2021.  At that time, I advised her to restart methimazole at 5 mg twice a day.  However, when she returned to see me, she was forgetting the second dose of the day approximately 50% of the time.  She did feel better after starting methimazole,  with less frequent stools, and also sleeping better.  She had palpitations for which she previously saw Dr. Radford Pax with cardiology.  When I saw her, pulse was over 99 so I suggested to start the beta-blocker but to discuss with cardiology first.  She ended up not starting a beta-blocker and her pulse improved per her Apple Watch.  We continued with 5 mg of methimazole daily and TFTs normalized in 05/2021.  We decreased the dose of methimazole to 5 mg every other day in 10/2021, when the TSH is higher in the normal range.  She did not return for labs as recommended afterwards. -At last visit, she had a weight loss of 26 pounds, but upon questioning this was intentional after starting Noom diet 6 months prior.  TFTs were fortunately normal.  We did discuss that we need to repeat labs in approximately 4 to 5 weeks every time we change the methimazole dose. - at this OV, she lost 12 more lbs! -As of now, she does not appear to require RAI ablation or thyroidectomy -We will recheck her TFTs at today's visit and change the methimazole dose accordingly. -No signs of active Graves' ophthalmopathy: Blurry vision, double vision, eye pain, chemosis  -I will see her back in 6 months but possibly sooner for labs  Needs refills.  Component     Latest Ref Rng 08/31/2022  Triiodothyronine,Free,Serum     2.3 - 4.2 pg/mL 3.0   TSH     0.35 - 5.50 uIU/mL 1.61   T4,Free(Direct)     0.60 - 1.60 ng/dL 0.74   TSI     <140 % baseline 153 (H)    Philemon Kingdom, MD PhD [facility]

## 2022-09-04 ENCOUNTER — Encounter: Payer: Self-pay | Admitting: Internal Medicine

## 2022-09-04 LAB — THYROID STIMULATING IMMUNOGLOBULIN: TSI: 153 % baseline — ABNORMAL HIGH (ref <140)

## 2022-09-04 MED ORDER — METHIMAZOLE 5 MG PO TABS: 5.0000 mg | ORAL_TABLET | ORAL | 3 refills | Status: DC

## 2022-11-08 ENCOUNTER — Ambulatory Visit: Payer: 59 | Admitting: Family

## 2022-11-08 VITALS — BP 116/64 | HR 91 | Temp 98.9°F | Resp 19 | Ht 68.0 in | Wt 141.8 lb

## 2022-11-08 DIAGNOSIS — J019 Acute sinusitis, unspecified: Secondary | ICD-10-CM | POA: Diagnosis not present

## 2022-11-08 MED ORDER — FLUTICASONE PROPIONATE 50 MCG/ACT NA SUSP: 2.0000 | Freq: Every day | NASAL | 6 refills | Status: DC

## 2022-11-08 MED ORDER — AZITHROMYCIN 250 MG PO TABS: ORAL_TABLET | ORAL | 0 refills | Status: DC

## 2022-11-08 NOTE — Progress Notes (Signed)
Whitney Bowman is a 52 y.o. 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 6 tablet 0   fluticasone (FLONASE) 50 MCG/ACT nasal spray Place 2 sprays into both nostrils daily. 16 g 6   cyclobenzaprine (FLEXERIL) 5 MG tablet Take 5 mg by mouth as needed (muscle spasms/cramps).      methimazole (TAPAZOLE) 5 MG tablet Take 1 tablet (5 mg total) by mouth every other day. 45 tablet 3   Multiple Vitamin (MULTIVITAMIN) tablet Take 1 tablet by mouth daily.     No current facility-administered medications for this visit.    Allergies: Cefdinir, Shellfish allergy, Sudafed [pseudoephedrine hcl], Cephalosporins, and Codeine  Past Medical History:  Diagnosis Date   DUB (dysfunctional uterine bleeding)    History of closed head injury    per pt age 71 fell and had closed skull fracture , stated brief loc by no coma , and no residuals   History of Graves' disease    endocrinologist--  dr Elvera Lennox-- dx 2018,  was on medication until 2019 due to lab work normallized, felt it may have to have taking OTC seaweed with four times the iodine   History of positive PPD    per pt positive skin test and cxr was abnormal was medication for a full year approx. 1991   Hypertension    Intermittent palpitations    cardiologist--- dr t. turner--  normal ETT ,echo , and CTA normal   Wears glasses     Past Surgical History:  Procedure Laterality Date   DILATATION & CURRETTAGE/HYSTEROSCOPY WITH RESECTOCOPE N/A 07/19/2020    Procedure: DILATATION & CURETTAGE/HYSTEROSCOPY WITH RESECTOCOPE;  Surgeon: Maxie Better, MD;  Location: [facility];  Service: Gynecology;  Laterality: N/A;   HYSTEROSCOPY WITH RESECTOSCOPE N/A 07/19/2020   Procedure: HYSTEROSCOPY WITH RESECTOSCOPE;  Surgeon: Maxie Better, MD;  Location: [facility];  Service: Gynecology;  Laterality: N/A;   UMBILICAL HERNIA REPAIR  08-09-2009  @[email or web address]     Family History  Problem Relation Age of Onset   Hypertension Mother    Hypertension Father     Social History   Tobacco Use   Smoking status: Never   Smokeless tobacco: Never  Substance Use Topics   Alcohol use: Yes    Alcohol/week: 4.0 - 5.0 standard drinks of alcohol    Types: 4 - 5 Glasses of wine per week    Subjective:   Presents with concerns for sinus infection; is using Nyquil and Flonase; requesting Rx for Z-pak; did recently fly back from [place] and noticed that pressure in ears was worse with flight than normal;   Objective:  Vitals:   11/08/22 1301  BP: 116/64  Pulse: 91  Resp: 19  Temp: 98.9 F (37.2 C)  TempSrc: Oral  SpO2: 99%  Weight: 141 lb 12.8 oz (64.3 kg)  Height: 5\' 8"  (1.727 m)    General: Well developed, well nourished, in no acute distress  Skin : Warm and dry.  Head: Normocephalic and atraumatic  Eyes:  Sclera and conjunctiva clear; pupils round and reactive to light; extraocular movements intact  Ears: External normal; canals clear; tube in place R ear; left ear congested;   Oropharynx: Pink, supple. No suspicious lesions  Neck: Supple without thyromegaly, adenopathy  Lungs: Respirations unlabored; clear to auscultation bilaterally without wheeze, rales, rhonchi  CVS exam: normal rate and regular rhythm.  Neurologic: Alert and oriented; speech intact; face symmetrical; moves all extremities well; CNII-XII intact without focal deficit   Assessment:  1. Acute sinusitis, recurrence not specified, unspecified location      Plan:  Rx for Z-pak #1 take as directed; refill updated on Flonase; increase fluids, rest and follow up worse, no better.   No follow-ups on file.  No orders of the defined types were placed in this encounter.   Requested Prescriptions   Signed Prescriptions Disp Refills   fluticasone (FLONASE) 50 MCG/ACT nasal spray 16 g 6    Sig: Place 2 sprays into both nostrils daily.   azithromycin (ZITHROMAX Z-PAK) 250 MG tablet 6 tablet 0    Sig: Take 2 tablets (500 mg) PO today, then 1 tablet (250 mg) PO daily x4 days.

## 2023-02-25 LAB — HM PAP SMEAR: HPV, high-risk: NOT DETECTED

## 2023-02-25 LAB — RESULTS CONSOLE HPV: CHL HPV: NEGATIVE

## 2023-03-25 ENCOUNTER — Ambulatory Visit: Payer: 59 | Admitting: Internal Medicine

## 2023-03-25 ENCOUNTER — Encounter: Payer: Self-pay | Admitting: Internal Medicine

## 2023-03-25 VITALS — BP 104/68 | HR 66 | Ht 68.0 in | Wt 139.0 lb

## 2023-03-25 DIAGNOSIS — E0500 Thyrotoxicosis with diffuse goiter without thyrotoxic crisis or storm: Secondary | ICD-10-CM

## 2023-03-25 LAB — T4, FREE: Free T4: 0.85 ng/dL (ref 0.60–1.60)

## 2023-03-25 LAB — TSH: TSH: 2.31 u[IU]/mL (ref 0.35–5.50)

## 2023-03-25 LAB — T3, FREE: T3, Free: 3.5 pg/mL (ref 2.3–4.2)

## 2023-03-25 NOTE — Patient Instructions (Addendum)
Please continue Methimazole 5 mg every other day.  Please stop at the lab.  You should have an endocrinology follow-up appointment in 1 year but with labs at 6 months.

## 2023-03-25 NOTE — Progress Notes (Addendum)
Patient ID: Whitney Bowman, female   DOB: April 30, 1971, 52 y.o.   MRN: [id number]   HPI  Whitney Bowman is a 52 y.o.-year-old female, initially referred by her PCP, Dr. Patsy Lager for evaluation for Graves' disease.  Last visit 6 months ago.  Interim history: No tremors, insomnia, palpitations, and hyperdefecation.   She has occasional hot flushes at night. She continues to travel for work. She started normal diet 08/2021.  At last visit, she was avoiding processed foods and high calorie density foods.  Reviewed history: Pt's TFTs were found to be abnormal this Spring- during an APE. Retrospectively, she had tachycardia for 10 years after the birth of her second child >> palpitations.  She started taking a cleanse supplement with iodine, kelp, seaweed (list of ingredients - scanned) in mid-07/2016 >> taking it for 2 months when she had labs in 09/2016.  She stopped these right after the labs returned.  She remembers having had a URI  in 06/2017: Bronchitis/sinusitis.  We started methimazole 5 mg twice daily.    We were able to decrease the dose to 2.5 mg daily in 06/2017 and I advised her to stop the methimazole completely in 09/2017.  She read the message through my chart but did not stop the medication.     At our visit from 06/2018, I advised her to stop methimazole completely, which she did.   However, in 02/2020, she had a mildly low TSH and a year later, in 02/2021 TSH was undetectable.  Methimazole was restarted then.  In 04/2021, I advised her to continue methimazole 5 mg daily.  In 10/2021, I advised her to decrease methimazole to 5 mg every other day.    Reviewed her TFTs: Lab Results  Component Value Date   TSH 1.61 08/31/2022   TSH 1.65 02/27/2022   TSH 3.36 11/07/2021   TSH 3.11 08/29/2021   TSH 2.23 06/27/2021   TSH 0.01 (L) 05/11/2021   TSH <0.01 (L) 04/17/2021   TSH <0.01 Repeated and verified X2. (L) 03/09/2021   TSH 0.32 (L) 03/07/2020   TSH 1.68 12/17/2018    FREET4 0.74 08/31/2022   FREET4 0.73 02/27/2022   FREET4 0.80 11/07/2021   FREET4 0.70 08/29/2021   FREET4 0.62 06/27/2021   FREET4 0.85 05/11/2021   FREET4 0.97 12/17/2018   FREET4 0.93 09/02/2018   FREET4 0.68 07/16/2018   FREET4 0.84 01/24/2018   T3FREE 3.0 08/31/2022   T3FREE 2.8 02/27/2022   T3FREE 3.1 11/07/2021   T3FREE 3.5 08/29/2021   T3FREE 3.4 06/27/2021   T3FREE 3.5 05/11/2021   T3FREE 3.6 04/17/2021   T3FREE 5.0 (H) 03/09/2021   T3FREE 3.6 03/07/2020   T3FREE 3.3 12/17/2018   Graves' antibodies were elevated: Lab Results  Component Value Date   TSI 153 (H) 08/31/2022   TSI 251 (H) 05/11/2021   TSI <89 07/16/2018   TSI 241 (H) 12/21/2016   Her thyrotropin receptor antibodies were elevated: Component     Latest Ref Rng & Units 10/25/2016  Thyrotropin Receptor Ab     <=16.0 % 40.5 (H)    Pt denies: - feeling nodules in neck - hoarseness - dysphagia - choking She has a history of palpitations (after her second pregnancy: Sees Dr. Mayford Knife with cardiology every other year; had a Holter monitor), insomnia, fatigue, initially weight loss but she has gained weight afterwards, anxiety, excessive sweating and heat intolerance, mild occasional tremors.  The symptoms resolved on methimazole.  Pt does not have a FH of  thyroid ds. No FH of thyroid cancer. No h/o radiation tx to head or neck. No recent steroids use. She has family history of RA.  She had DUB and was previously on OCPs but stopped due to weight gain.  However, afterwards, she had D&C in 06/2020. She had a ear tube placed 06/2022 - after pain during flights. Had transient hearing loss. Had a steroid taper for this.  ROS:  + see HPI  I reviewed pt's medications, allergies, PMH, social hx, family hx, and changes were documented in the history of present illness. Otherwise, unchanged from my initial visit note.  Past Medical History:  Diagnosis Date   DUB (dysfunctional uterine bleeding)    History of  closed head injury    per pt age 11 fell and had closed skull fracture , stated brief loc by no coma , and no residuals   History of Graves' disease    endocrinologist--  dr Elvera Lennox-- dx 2018,  was on medication until 2019 due to lab work normallized, felt it may have to have taking OTC seaweed with four times the iodine   History of positive PPD    per pt positive skin test and cxr was abnormal was medication for a full year approx. 1991   Hypertension    Intermittent palpitations    cardiologist--- dr t. turner--  normal ETT ,echo , and CTA normal   Wears glasses    Past Surgical History:  Procedure Laterality Date   DILATATION & CURRETTAGE/HYSTEROSCOPY WITH RESECTOCOPE N/A 07/19/2020   Procedure: DILATATION & CURETTAGE/HYSTEROSCOPY WITH RESECTOCOPE;  Surgeon: Maxie Better, MD;  Location: [facility];  Service: Gynecology;  Laterality: N/A;   HYSTEROSCOPY WITH RESECTOSCOPE N/A 07/19/2020   Procedure: HYSTEROSCOPY WITH RESECTOSCOPE;  Surgeon: Maxie Better, MD;  Location: [facility];  Service: Gynecology;  Laterality: N/A;   UMBILICAL HERNIA REPAIR  08-09-2009  @[email or web address]    Social History   Social History   Marital status: Married    Spouse name: N/A   Number of children: 2 sons   Occupational History   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 6 tablet 0   cyclobenzaprine (FLEXERIL) 5 MG tablet Take 5 mg by mouth as needed (muscle spasms/cramps).      fluticasone (FLONASE) 50 MCG/ACT nasal spray Place 2 sprays into both nostrils daily. 16 g 6   methimazole (TAPAZOLE) 5 MG tablet  Take 1 tablet (5 mg total) by mouth every other day. 45 tablet 3   Multiple Vitamin (MULTIVITAMIN) tablet Take 1 tablet by mouth daily.     No current facility-administered medications on file prior to visit.   Allergies  Allergen Reactions   Cefdinir Diarrhea    REACTION: PROTRACTED DIARRHEA/ COLITIS   Shellfish Allergy Diarrhea and Nausea Only   Sudafed [Pseudoephedrine Hcl]     Tachycardia   Cephalosporins Diarrhea    severe   Codeine Nausea And Vomiting   Family History  Problem Relation Age of Onset   Hypertension Mother    Hypertension Father  Mother and father also had melanoma.  PE: BP 104/68 (BP Location: Left Arm, Patient Position: Sitting, Cuff Size: Small)   Pulse 66   Ht 5\' 8"  (1.727 m)   Wt 139 lb (63 kg)   SpO2 95%   BMI 21.13 kg/m  Wt Readings from Last 3 Encounters:  03/25/23 139 lb (63 kg)  11/08/22 141 lb 12.8 oz (64.3 kg)  08/31/22 138 lb 9.6 oz (62.9 kg)   Constitutional: normal weight, in NAD Eyes:  EOMI, no exophthalmos ENT: no neck masses, no cervical lymphadenopathy Cardiovascular: RRR, No MRG Respiratory: CTA B Musculoskeletal: no deformities Skin:no rashes Neurological: no tremor with outstretched hands  ASSESSMENT: 1.Graves' disease -recurrent  PLAN:  1. Patient with history of Graves' disease, diagnosed in 2018, initially with mild thyrotoxic symptoms: Some weight loss, heat intolerance, anxiety, tremors, palpitations.  Her TSI antibodies and TRAb antibodies were positive.  We initially started her on methimazole 5 mg twice a day we were able to reduce the dose initially and then stopped in 06/2018.  She was lost to follow-up afterwards.  Her PCP contacted me due to undetectable TSH levels in 08 and 03/2021.  At that time, I advised her to restart methimazole at 5 mg twice a day.  However, when she returns to see me, she was forgetting the second dose of the day approximately 50% of the time.  She did feel better after starting  methimazole, with less frequent stools and also sleeping better.  She had palpitations for which she previously saw Dr. Mayford Knife with cardiology.  When I saw her, pulse was over 99/suggested to start a beta-blocker but to discuss with cardiology first.  She ended up not starting a beta-blocker and her pulse improved per her Apple Watch.  We continued with the 5 mg of methimazole daily and TFTs normalized in 05/2021.  We then decrease the dose of methimazole to 5 mg every other day in 10/2021 when the TSH was higher in the normal range.  She did not return for labs as recommended afterwards.  When she returned for another visit in 02/2022, she had a weight loss of 26 pounds, but upon questioning, this was intentional, after starting Noom diet 6 months prior.  Fortunately, TFTs were normal.  At last visit, she lost 12 more pounds.  TFTs were still normal.  TSI's were still detectable, but only slightly above the upper limit of the target range, much improved. -At today's visit, she is asymptomatic, without thyrotoxic signs or symptoms -As a, she does not appear to require RAI ablation or thyroidectomy.  We discussed that staying on a low-dose of methimazole was found to be safe and  also effective in preventing Graves' ds. recurrence.  -We will check her TFTs today and change the methimazole dose accordingly -She does not appear to have signs of active Graves' ophthalmopathy: Blurry vision, double vision, eye pain, chemosis -I will see her back in a year, but in 6 months for labs  Needs refills.  Component     Latest Ref Rng 03/25/2023  TSH     0.35 - 5.50 uIU/mL 2.31   Triiodothyronine,Free,Serum     2.3 - 4.2 pg/mL 3.5   T4,Free(Direct)     0.60 - 1.60 ng/dL 1.30   TFTs normal.  We can continue with the methimazole 5 mg every other day for now.  Will repeat labs in 6 months.  Component     Latest Ref Rng 03/25/2023  TSI     <  140 % baseline 184 (H)   TSI antibodies are slightly higher.  Carlus Pavlov, MD PhD [facility]

## 2023-03-27 MED ORDER — METHIMAZOLE 5 MG PO TABS: 5.0000 mg | ORAL_TABLET | ORAL | 3 refills | Status: DC

## 2023-03-29 LAB — THYROID STIMULATING IMMUNOGLOBULIN: TSI: 184 %{baseline} — ABNORMAL HIGH (ref <140)

## 2023-04-24 ENCOUNTER — Other Ambulatory Visit: Payer: Self-pay | Admitting: Obstetrics and Gynecology

## 2023-04-24 DIAGNOSIS — Z1231 Encounter for screening mammogram for malignant neoplasm of breast: Secondary | ICD-10-CM

## 2023-05-03 ENCOUNTER — Other Ambulatory Visit: Payer: Self-pay | Admitting: Family Medicine

## 2023-05-03 DIAGNOSIS — Z1212 Encounter for screening for malignant neoplasm of rectum: Secondary | ICD-10-CM

## 2023-05-03 DIAGNOSIS — Z1211 Encounter for screening for malignant neoplasm of colon: Secondary | ICD-10-CM

## 2023-05-06 ENCOUNTER — Encounter: Payer: Self-pay | Admitting: Family Medicine

## 2023-05-23 ENCOUNTER — Encounter: Payer: Self-pay | Admitting: Family Medicine

## 2023-05-23 LAB — COLOGUARD: COLOGUARD: NEGATIVE

## 2023-06-06 ENCOUNTER — Ambulatory Visit: Payer: 59 | Admitting: Cardiology

## 2023-06-07 ENCOUNTER — Ambulatory Visit
Admission: RE | Admit: 2023-06-07 | Discharge: 2023-06-07 | Disposition: A | Discharge status: Home / Self Care | Payer: 59 | Source: Ambulatory Visit | Attending: Obstetrics and Gynecology | Admitting: Obstetrics and Gynecology

## 2023-06-07 DIAGNOSIS — Z1231 Encounter for screening mammogram for malignant neoplasm of breast: Secondary | ICD-10-CM

## 2023-06-10 ENCOUNTER — Encounter: Payer: Self-pay | Admitting: Cardiology

## 2023-06-10 ENCOUNTER — Ambulatory Visit: Payer: 59 | Attending: Cardiology | Admitting: Cardiology

## 2023-06-10 VITALS — BP 118/62 | HR 66 | Ht 68.0 in | Wt 144.2 lb

## 2023-06-10 DIAGNOSIS — I10 Essential (primary) hypertension: Secondary | ICD-10-CM

## 2023-06-10 DIAGNOSIS — Z8249 Family history of ischemic heart disease and other diseases of the circulatory system: Secondary | ICD-10-CM

## 2023-06-10 MED ORDER — DIPHENHYDRAMINE HCL 50 MG PO TABS: 50.0000 mg | ORAL_TABLET | Freq: Once | ORAL | 0 refills | Status: DC

## 2023-06-10 MED ORDER — PREDNISONE 50 MG PO TABS: ORAL_TABLET | ORAL | 0 refills | Status: DC

## 2023-06-10 NOTE — Progress Notes (Signed)
Cardiology Office Note    Date:  06/10/2023   ID:  Whitney Bowman, DOB 1970/08/30, MRN [id number]  PCP:  Pearline Cables, MD  Cardiologist:  Armanda Magic, MD   Chief Complaint  Patient presents with   Follow-up    Family history of aortic dissection and HOCM    History of Present Illness:  Whitney Bowman is a 52 y.o. female with a hx of palpitations and sinus tachycardia who was first evaluated in 2010 and then again in 2015 for CP with normal ETT and echo.  She was referred back again for evaluation of HTN.  She was dx with Grave's disease and was placed on Methimazole but subsequently stopped.  She was seen by Endocrinology and was complaining of palpitations and was referred back for evaluation. She apparently was placed on OCP due to ovarian cysts and then developed elevated BP and her OCP was changed to a POP and then she was seen back by her PCP and BP improved.    Due to a fm hx of ? Aortic dissection in her mom who died in the OR,  the patient was concerned about the BP and saw me again.  She had a negative chest and abdominal CTA showing no evidence of dissection or aneurysm.  2D echo showed normal LVF.  he is here today for followup and is doing well.  He denies any chest pain or pressure, SOB, DOE, PND, orthopnea, LE edema, dizziness, palpitations or syncope. He is compliant with his meds and is tolerating meds with no SE.     Past Medical History:  Diagnosis Date   DUB (dysfunctional uterine bleeding)    Family history of aortic dissection    Family history of hypertrophic cardiomyopathy    History of closed head injury    per pt age 77 fell and had closed skull fracture , stated brief loc by no coma , and no residuals   History of Graves' disease    endocrinologist--  dr Elvera Lennox-- dx 2018,  was on medication until 2019 due to lab work normallized, felt it may have to have taking OTC seaweed with four times the iodine   History of positive PPD    per pt positive  skin test and cxr was abnormal was medication for a full year approx. 1991   Hypertension    Intermittent palpitations    cardiologist--- dr t. Brentin Shin--  normal ETT ,echo , and CTA normal   Wears glasses     Past Surgical History:  Procedure Laterality Date   DILATATION & CURRETTAGE/HYSTEROSCOPY WITH RESECTOCOPE N/A 07/19/2020   Procedure: DILATATION & CURETTAGE/HYSTEROSCOPY WITH RESECTOCOPE;  Surgeon: Maxie Better, MD;  Location: [facility];  Service: Gynecology;  Laterality: N/A;   HYSTEROSCOPY WITH RESECTOSCOPE N/A 07/19/2020   Procedure: HYSTEROSCOPY WITH RESECTOSCOPE;  Surgeon: Maxie Better, MD;  Location: [facility] ;  Service: Gynecology;  Laterality: N/A;   UMBILICAL HERNIA REPAIR  08-09-2009  @[facility]     Current Medications: No outpatient medications have been marked as taking for the 06/10/23 encounter (Office Visit) with Quintella Reichert, MD.    Allergies:   Cefdinir, Shellfish allergy, Sudafed [pseudoephedrine hcl], Cephalosporins, and Codeine   Social History   Socioeconomic History   Marital status: Married    Spouse name: Not on file   Number of children: Not on file   Years of education: Not on file   Highest education level: Master's degree (e.g., MA, MS, MEng, MEd,  MSW, MBA)  Occupational History   Not on file  Tobacco Use   Smoking status: Never   Smokeless tobacco: Never  Vaping Use   Vaping status: Never Used  Substance and Sexual Activity   Alcohol use: Yes    Alcohol/week: 4.0 - 5.0 standard drinks of alcohol    Types: 4 - 5 Glasses of wine per week   Drug use: Never   Sexual activity: Not on file    Comment: husband had vasectomy  Other Topics Concern   Not on file  Social History Narrative   Not on file   Social Determinants of Health   Financial Resource Strain: Low Risk  (11/08/2022)   Overall Financial Resource Strain (CARDIA)    Difficulty of Paying Living Expenses: Not hard at all  Food  Insecurity: No Food Insecurity (11/08/2022)   Hunger Vital Sign    Worried About Running Out of Food in the Last Year: Never true    Ran Out of Food in the Last Year: Never true  Transportation Needs: No Transportation Needs (11/08/2022)   PRAPARE - Administrator, Civil Service (Medical): No    Lack of Transportation (Non-Medical): No  Physical Activity: Sufficiently Active (11/08/2022)   Exercise Vital Sign    Days of Exercise per Week: 5 days    Minutes of Exercise per Session: 90 min  Stress: No Stress Concern Present (11/08/2022)   [employer] of Occupational Health - Occupational Stress Questionnaire    Feeling of Stress : Only a little  Social Connections: Socially Integrated (11/08/2022)   Social Connection and Isolation Panel [NHANES]    Frequency of Communication with Friends and Family: More than three times a week    Frequency of Social Gatherings with Friends and Family: Once a week    Attends Religious Services: More than 4 times per year    Active Member of [employer] or Organizations: Yes    Attends Engineer, structural: More than 4 times per year    Marital Status: Married     Family History:  The patient's family history includes Hypertension in her father and mother.   ROS:   Please see the history of present illness.    ROS All other systems reviewed and are negative.      No data to display           PHYSICAL EXAM:   VS:  There were no vitals taken for this visit.    GEN: Well nourished, well developed in no acute distress HEENT: Normal NECK: No JVD; No carotid bruits LYMPHATICS: No lymphadenopathy CARDIAC:RRR, no murmurs, rubs, gallops RESPIRATORY:  Clear to auscultation without rales, wheezing or rhonchi  ABDOMEN: Soft, non-tender, non-distended MUSCULOSKELETAL:  No edema; No deformity  SKIN: Warm and dry NEUROLOGIC:  Alert and oriented x 3 PSYCHIATRIC:  Normal affect   Wt Readings from Last 3 Encounters:  03/25/23 139 lb  (63 kg)  11/08/22 141 lb 12.8 oz (64.3 kg)  08/31/22 138 lb 9.6 oz (62.9 kg)      Studies/Labs Reviewed:   EKG Interpretation Date/Time:  Monday June 10 2023 08:23:23 EST Ventricular Rate:  66 PR Interval:  158 QRS Duration:  70 QT Interval:  406 QTC Calculation: 425 R Axis:   44  Text Interpretation: Normal sinus rhythm Normal ECG When compared with ECG of 02-Feb-2010 08:23, No significant change was found Confirmed by Armanda Magic ([id number]) on 06/10/2023 8:24:03 AM     2D echo 12/202 IMPRESSIONS  1. Left ventricular ejection fraction, by visual estimation, is 60 to  65%. The left ventricle has normal function. There is no left ventricular  hypertrophy.   2. Left ventricular diastolic parameters are indeterminate.   3. The left ventricle has no regional wall motion abnormalities.   4. Global right ventricle has normal systolic function.The right  ventricular size is normal. No increase in right ventricular wall  thickness.   5. Left atrial size was normal.   6. Right atrial size was normal.   7. The mitral valve is normal in structure. Trivial mitral valve  regurgitation. No evidence of mitral stenosis.   8. The tricuspid valve is normal in structure. Tricuspid valve  regurgitation is mild.   9. The aortic valve is normal in structure. Aortic valve regurgitation is  not visualized. No evidence of aortic valve sclerosis or stenosis.  10. The pulmonic valve was normal in structure. Pulmonic valve  regurgitation is not visualized.  11. Mildly elevated pulmonary artery systolic pressure.  12. The inferior vena cava is normal in size with greater than 50%  respiratory variability, suggesting right atrial pressure of 3 mmHg.   Recent Labs: 03/25/2023: TSH 2.31   Lipid Panel    Component Value Date/Time   CHOL 201 (H) 03/09/2021 1057   TRIG 148.0 03/09/2021 1057   HDL 76.10 03/09/2021 1057   CHOLHDL 3 03/09/2021 1057   VLDL 29.6 03/09/2021 1057   LDLCALC 95  03/09/2021 1057    Additional studies/ records that were reviewed today include:  Chest and abdominal CTA, 2D echo    ASSESSMENT:    1. Essential hypertension   2. Family history of aortic dissection       PLAN:  In order of problems listed above:  1.  Hypertension  -Bp controlled on exam today -diet controlled  2.   Family hx of aortic disease  -her mother died of aortic dissection and also had an iliac artery aneurysm and also had HOCM.   -Her moms father had a descending aortic aneurysm and iliac aneurysms.  -screening Chest and abdominal CTA showed no aneurysm.  -she was referred for genetic counseling and it was recommended that echo and CT be done and if normal then no further genetic testing would be recommended  -Chest CTA 2020 showed no evidence of thoracic/aortoiliac aneurysm dissection or other findings. -Chest CTA 2023 showed normal aorta -will get Abd CTA  -BP is well-controlled on exam today -Will repeat 2D echo to assess for HOCM -Chest and Abd CTA in 5 years for reassessment    Medication Adjustments/Labs and Tests Ordered: Current medicines are reviewed at length with the patient today.  Concerns regarding medicines are outlined above.  Medication changes, Labs and Tests ordered today are listed in the Patient Instructions below.  There are no Patient Instructions on file for this visit.    Signed, Armanda Magic, MD  06/10/2023 8:07 AM    [facility] [address] [place], [place], [place]  [address] Phone: [phone]; Fax: [phone]

## 2023-06-10 NOTE — Patient Instructions (Addendum)
Medication Instructions:  Your physician recommends that you continue on your current medications as directed. Please refer to the Current Medication list given to you today.  *If you need a refill on your cardiac medications before your next appointment, please call your pharmacy*   Lab Work: None.  If you have labs (blood work) drawn today and your tests are completely normal, you will receive your results only by: MyChart Message (if you have MyChart) OR A paper copy in the mail If you have any lab test that is abnormal or we need to change your treatment, we will call you to review the results.   Testing/Procedures: Your physician has requested that you have an echocardiogram. Echocardiography is a painless test that uses sound waves to create images of your heart. It provides your doctor with information about the size and shape of your heart and how well your heart's chambers and valves are working. This procedure takes approximately one hour. There are no restrictions for this procedure. Please do NOT wear cologne, perfume, aftershave, or lotions (deodorant is allowed). Please arrive 15 minutes prior to your appointment time.  Please note: We ask at that you not bring children with you during ultrasound (echo/ vascular) testing. Due to room size and safety concerns, children are not allowed in the ultrasound rooms during exams. Our front office staff cannot provide observation of children in our lobby area while testing is being conducted. An adult accompanying a patient to their appointment will only be allowed in the ultrasound room at the discretion of the ultrasound technician under special circumstances. We apologize for any inconvenience.   Your doctor has ordered a CT abdomen with contrast to evaluate you for aortic aneurysm.  Patient will need a prescription for Prednisone and very clear instructions (as follows): Prednisone 50 mg - take 13 hours prior to test Take another  Prednisone 50 mg 7 hours prior to test Take another Prednisone 50 mg 1 hour prior to test Take Benadryl 50 mg 1 hour prior to test Patient must complete all four doses of above prophylactic medications. Patient will need a ride after test due to Benadryl.  After the Test: Drink plenty of water. After receiving IV contrast, you may experience a mild flushed feeling. This is normal. On occasion, you may experience a mild rash up to 24 hours after the test. This is not dangerous. If this occurs, you can take Benadryl 25 mg and increase your fluid intake. If you experience trouble breathing, this can be serious. If it is severe call 911 IMMEDIATELY. If it is mild, please call our office. If you take any of these medications: Glipizide/Metformin, Avandament, Glucavance, please do not take 48 hours after completing test unless otherwise instructed.     Follow-Up: At [facility], you and your health needs are our priority.  As part of our continuing mission to provide you with exceptional heart care, we have created designated Provider Care Teams.  These Care Teams include your primary Cardiologist (physician) and Advanced Practice Providers (APPs -  Physician Assistants and Nurse Practitioners) who all work together to provide you with the care you need, when you need it.  We recommend signing up for the patient portal called "MyChart".  Sign up information is provided on this After Visit Summary.  MyChart is used to connect with patients for Virtual Visits (Telemedicine).  Patients are able to view lab/test results, encounter notes, upcoming appointments, etc.  Non-urgent messages can be sent to your provider  as well.   To learn more about what you can do with MyChart, go to [email or web address].    Your next appointment:   1 year(s)  Provider:   Armanda Magic, MD     Other Instructions You will need to take medication prior to your CT scan to prevent an allergic reaction. Our  office will call you with instructions and direct you where to pick up your medication.

## 2023-06-11 ENCOUNTER — Encounter: Payer: Self-pay | Admitting: Cardiology

## 2023-06-11 ENCOUNTER — Other Ambulatory Visit: Payer: Self-pay

## 2023-06-11 ENCOUNTER — Telehealth: Payer: Self-pay | Admitting: Cardiology

## 2023-06-11 MED ORDER — PREDNISONE 50 MG PO TABS: ORAL_TABLET | ORAL | 0 refills | Status: DC

## 2023-06-11 NOTE — Progress Notes (Signed)
Pt had been prescribed Prednisone for her upcoming scan that involved contrast but her pharmacy had not received the rx. It looks like the original order was a printed rx but the pt doesn't have a printed rx and Dr. Mayford Knife is not in the office today. New rx sent to [facility]. Pt made aware.

## 2023-06-11 NOTE — Progress Notes (Signed)
Pt stated pharmacy still had not received the Prednisone prescription. It appeared that the way the order had been originally entered wouldn't allow for it to be sent over electronically. Order fixed and the pt was made aware. LVM for pt stating to call back if any other issue occur.

## 2023-06-11 NOTE — Telephone Encounter (Signed)
*  STAT* If patient is at the pharmacy, call can be transferred to refill team.   1. Which medications need to be refilled? (please list name of each medication and dose if known)  predniSONE (DELTASONE) 50 MG tablet  2. Which pharmacy/location (including street and city if local pharmacy) is medication to be sent to? [id number] - [place], [place] - [address]  3. Do they need a 30 day or 90 day supply?   Patient spoke with pharmacy and the current prescription was not received. She would like to have it sent in again if possible.

## 2023-06-14 ENCOUNTER — Ambulatory Visit (HOSPITAL_COMMUNITY)
Admission: RE | Admit: 2023-06-14 | Discharge: 2023-06-14 | Disposition: A | Discharge status: Home / Self Care | Payer: 59 | Source: Ambulatory Visit | Attending: Cardiology | Admitting: Cardiology

## 2023-06-14 DIAGNOSIS — Z8249 Family history of ischemic heart disease and other diseases of the circulatory system: Secondary | ICD-10-CM | POA: Insufficient documentation

## 2023-06-14 DIAGNOSIS — Z136 Encounter for screening for cardiovascular disorders: Secondary | ICD-10-CM | POA: Diagnosis present

## 2023-06-14 MED ORDER — IOHEXOL 350 MG/ML SOLN
75.0000 mL | Freq: Once | INTRAVENOUS | Status: AC | PRN
  Administered 2023-06-14: 75 mL via INTRAVENOUS

## 2023-06-20 ENCOUNTER — Encounter: Payer: Self-pay | Admitting: Cardiology

## 2023-07-04 ENCOUNTER — Encounter: Payer: Self-pay | Admitting: Cardiology

## 2023-07-05 ENCOUNTER — Encounter: Payer: Self-pay | Admitting: Family Medicine

## 2023-07-05 ENCOUNTER — Telehealth: Payer: Self-pay

## 2023-07-05 DIAGNOSIS — K3184 Gastroparesis: Secondary | ICD-10-CM

## 2023-07-05 DIAGNOSIS — R1084 Generalized abdominal pain: Secondary | ICD-10-CM

## 2023-07-05 NOTE — Telephone Encounter (Signed)
Call to patient to advise that CT abd shows no aortic aneurysm,  but stomach shows significant distension that may be caused by gastroparesis. Patient states she has never seen a  GI MD before and agrees to referral to Dr. Marca Ancona at [facility]. Referral placed.  Call to [facility] who advises earliest appts are in January. I advised I would put referral in as STAT and Jasmine December stated she may be able to work patient in sooner.

## 2023-07-05 NOTE — Telephone Encounter (Signed)
-----   Message from Armanda Magic sent at 07/04/2023  4:55 PM EST ----- No aortic aneurysm.  The stomach is distended significantly and could represent narrowing as the stomach goes into the small intestine or due to decreased contractions of the stomach called gastropaeresis - please get in with Dr. Marca Ancona with [facility] ASAP

## 2023-07-12 ENCOUNTER — Encounter: Payer: Self-pay | Admitting: Cardiology

## 2023-07-12 ENCOUNTER — Ambulatory Visit (HOSPITAL_COMMUNITY): Payer: 59 | Attending: Cardiology

## 2023-07-12 DIAGNOSIS — Z8249 Family history of ischemic heart disease and other diseases of the circulatory system: Secondary | ICD-10-CM

## 2023-07-12 LAB — ECHOCARDIOGRAM COMPLETE
Area-P 1/2: 3.21 cm2
S' Lateral: 2.5 cm

## 2023-09-20 ENCOUNTER — Other Ambulatory Visit: Payer: Self-pay

## 2023-09-20 DIAGNOSIS — E0500 Thyrotoxicosis with diffuse goiter without thyrotoxic crisis or storm: Secondary | ICD-10-CM

## 2023-09-25 ENCOUNTER — Other Ambulatory Visit: Payer: 59

## 2023-09-26 ENCOUNTER — Encounter: Payer: Self-pay | Admitting: Internal Medicine

## 2023-09-26 DIAGNOSIS — E0500 Thyrotoxicosis with diffuse goiter without thyrotoxic crisis or storm: Secondary | ICD-10-CM

## 2023-09-26 LAB — T3, FREE: T3, Free: 3 pg/mL (ref 2.3–4.2)

## 2023-09-26 LAB — T4, FREE: Free T4: 1.2 ng/dL (ref 0.8–1.8)

## 2023-09-26 LAB — TSH: TSH: 2.46 m[IU]/L

## 2023-09-26 MED ORDER — METHIMAZOLE 5 MG PO TABS: 5.0000 mg | ORAL_TABLET | ORAL | 3 refills | Status: DC

## 2023-11-18 LAB — HM COLONOSCOPY

## 2024-01-19 NOTE — Progress Notes (Addendum)
 [facility] [address], [address] [place], [place] [address] [phone]  Date:  01/23/2024   Name:  Whitney Bowman   DOB:  1971-07-15   MRN:  [id number]  PCP:  Watt Harlene BROCKS, MD    Chief Complaint: Annual Exam (Discuss migraine /One migraine every other month Janise have been more frequent over the past 12 months )   History of Present Illness:  Whitney Bowman is a 53 y.o. very pleasant female patient who presents with the following:  Patient seen today for physical exam.  She also has concern about worsening migraine headaches Most recent visit with myself was close to 2 years ago, September 2023  Pap smear- Dr Rutherford Mammogram is up-to-date Cologuard completed last year- however she ended up having a colon and UGI just this past April per Dr Saintclair  Shingrix Due for lab work update  Her children are 33 and 42- they are going to [facility] coming up  She is followed by endocrinology for history of Graves' disease-I believe she currently continues to take methimazole  She will be seen by endo later this summer.  They have tried to wean her off but it does not tend to go well   Seen by cardiology, Dr. Shlomo in November: 1.  Hypertension  -Bp controlled on exam today -diet controlled 2.   Family hx of aortic disease  -her mother died of aortic dissection and also had an iliac artery aneurysm and also had HOCM.   -Her moms father had a descending aortic aneurysm and iliac aneurysms.  -screening Chest and abdominal CTA showed no aneurysm.  -she was referred for genetic counseling and it was recommended that echo and CT be done and if normal then no further genetic testing would be recommended  -Chest CTA 2020 showed no evidence of thoracic/aortoiliac aneurysm dissection or other findings. -Chest CTA 2023 showed normal aorta -will get Abd CTA  -BP is well-controlled on exam today -Will repeat 2D echo to  assess for HOCM -Chest and Abd CTA in 5 years for reassessment  Pt notes migraine HA every 2 months for the last year or so- in the past she would get one maybe once a year Will last 18 hours and she will be totally incapacitated during the headache with vomiting and severe pain She is not aware of trigger though she notes headaches do tend to occur more when she is out of town traveling for work She does get aura some of the time but not always-she always has light sensitivity No other neurologic changes or symptoms noted with increase in headache frequency  She enjoys walking for exercise- has moved indoors right now during the hot months   She is getting occasional menses now during perimenopause   Patient Active Problem List   Diagnosis Date Noted   Essential hypertension 03/03/2020   Lateral epicondylitis of right elbow 06/18/2017   Graves disease 12/03/2016   Chest pain 11/13/2013   CAP (community acquired pneumonia) 08/22/2012   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    Past Medical History:  Diagnosis Date   DUB (dysfunctional uterine bleeding)    Family history of aortic dissection    Family history of hypertrophic cardiomyopathy    History of closed head injury    per pt age 74 fell and had closed skull fracture , stated brief loc by no coma , and no residuals   History  of Graves' disease    endocrinologist--  dr trixie-- dx 2018,  was on medication until 2019 due to lab work normallized, felt it may have to have taking OTC seaweed with four times the iodine   History of positive PPD    per pt positive skin test and cxr was abnormal was medication for a full year approx. 1991   Hypertension    Intermittent palpitations    cardiologist--- dr t. turner--  normal ETT ,echo , and CTA normal   Wears glasses     Past Surgical History:  Procedure Laterality Date   DILATATION & CURRETTAGE/HYSTEROSCOPY WITH RESECTOCOPE N/A 07/19/2020   Procedure: DILATATION &  CURETTAGE/HYSTEROSCOPY WITH RESECTOCOPE;  Surgeon: Rutherford Gain, MD;  Location: [facility];  Service: Gynecology;  Laterality: N/A;   HYSTEROSCOPY WITH RESECTOSCOPE N/A 07/19/2020   Procedure: HYSTEROSCOPY WITH RESECTOSCOPE;  Surgeon: Rutherford Gain, MD;  Location: [facility];  Service: Gynecology;  Laterality: N/A;   UMBILICAL HERNIA REPAIR  08-09-2009  @[facility]     Social History   Tobacco Use   Smoking status: Never   Smokeless tobacco: Never  Vaping Use   Vaping status: Never Used  Substance Use Topics   Alcohol use: Yes    Alcohol/week: 4.0 - 5.0 standard drinks of alcohol    Types: 4 - 5 Glasses of wine per week   Drug use: Never    Family History  Problem Relation Age of Onset   Hypertension Mother    Hypertension Father     Allergies  Allergen Reactions   Cefdinir Diarrhea    REACTION: PROTRACTED DIARRHEA/ COLITIS   Shellfish Allergy Diarrhea and Nausea Only   Sudafed [Pseudoephedrine Hcl]     Tachycardia   Cephalosporins Diarrhea    severe   Codeine Nausea And Vomiting    Medication list has been reviewed and updated.  Current Outpatient Medications on File Prior to Visit  Medication Sig Dispense Refill   cyclobenzaprine (FLEXERIL) 5 MG tablet Take 5 mg by mouth as needed (muscle spasms/cramps).      methimazole  (TAPAZOLE ) 5 MG tablet Take 1 tablet (5 mg total) by mouth every other day. 45 tablet 3   Multiple Vitamin (MULTIVITAMIN) tablet Take 1 tablet by mouth daily.     diphenhydrAMINE  (BENADRYL  ALLERGY EXTRA STR) 50 MG tablet Take 1 tablet (50 mg total) by mouth once for 1 dose. 1. Prednisone  50 mg - take 13 hours prior to test 2. Take another Prednisone  50 mg 7 hours prior to test 3. Take another Prednisone  50 mg 1 hour prior to test 4. Take Benadryl  50 mg 1 hour prior to test (Patient not taking: Reported on 01/23/2024) 1 tablet 0   No current facility-administered medications on file prior to visit.    Review of  Systems:  As per HPI- otherwise negative.   Physical Examination: Vitals:   01/23/24 0920  BP: 114/72  Pulse: 80  SpO2: 100%   Vitals:   01/23/24 0920  Weight: 148 lb (67.1 kg)  Height: 5' 8 (1.727 m)   Body mass index is 22.5 kg/m. Ideal Body Weight: Weight in (lb) to have BMI = 25: 164.1  GEN: no acute distress.  Looks well, normal weight HEENT: Atraumatic, Normocephalic.  Bilateral TM wnl, oropharynx normal.  PEERL,EOMI.   Ears and Nose: No external deformity. CV: RRR, No M/G/R. No JVD. No thrill. No extra heart sounds. PULM: CTA B, no wheezes, crackles, rhonchi. No retractions. No resp. distress. No accessory muscle  use. ABD: S, NT, ND, +BS. No rebound. No HSM. EXTR: No c/c/e PSYCH: Normally interactive. Conversant.    Assessment and Plan: Physical exam  Screening, lipid - Plan: Lipid panel  Screening for diabetes mellitus - Plan: Comprehensive metabolic panel with GFR, Hemoglobin A1c  Essential hypertension - Plan: CBC, Comprehensive metabolic panel with GFR  Graves disease  Intractable migraine with aura without status migrainosus - Plan: SUMAtriptan  (IMITREX ) 100 MG tablet  Physical exam today-encouraged healthy diet and exercise routine Routine blood work is pending as above She continues to take methimazole  with endocrinology supervision for history of Graves' disease She notes an increase in migraine headache frequency over the last year or so.  We will have her try a triptan as an abortive therapy.  However if this is not helpful or if her headaches continue to increase in frequency we can certainly look further.  She will keep me posted   Signed Harlene Schroeder, MD  Addendum 6/27, received labs as below.  Message to patient  Results for orders placed or performed in visit on 01/23/24  CBC   Collection Time: 01/23/24  9:54 AM  Result Value Ref Range   WBC 4.5 4.0 - 10.5 K/uL   RBC 3.90 3.87 - 5.11 Mil/uL   Platelets 260.0 150.0 - 400.0 K/uL    Hemoglobin 12.4 12.0 - 15.0 g/dL   HCT 63.7 63.9 - 53.9 %   MCV 92.9 78.0 - 100.0 fl   MCHC 34.3 30.0 - 36.0 g/dL   RDW 86.5 88.4 - 84.4 %  Comprehensive metabolic panel with GFR   Collection Time: 01/23/24  9:54 AM  Result Value Ref Range   Sodium 135 135 - 145 mEq/L   Potassium 4.8 3.5 - 5.1 mEq/L   Chloride 97 96 - 112 mEq/L   CO2 31 19 - 32 mEq/L   Glucose, Bld 62 (L) 70 - 99 mg/dL   BUN 12 6 - 23 mg/dL   Creatinine, Ser 9.23 0.40 - 1.20 mg/dL   Total Bilirubin 0.6 0.2 - 1.2 mg/dL   Alkaline Phosphatase 41 39 - 117 U/L   AST 18 0 - 37 U/L   ALT 11 0 - 35 U/L   Total Protein 7.1 6.0 - 8.3 g/dL   Albumin 4.8 3.5 - 5.2 g/dL   GFR 09.61 >39.99 mL/min   Calcium 9.6 8.4 - 10.5 mg/dL  Hemoglobin J8r   Collection Time: 01/23/24  9:54 AM  Result Value Ref Range   Hgb A1c MFr Bld 5.5 4.6 - 6.5 %  Lipid panel   Collection Time: 01/23/24  9:54 AM  Result Value Ref Range   Cholesterol 231 (H) 0 - 200 mg/dL   Triglycerides 24.9 0.0 - 149.0 mg/dL   HDL 889.09 >60.99 mg/dL   VLDL 84.9 0.0 - 59.9 mg/dL   LDL Cholesterol 894 (H) 0 - 99 mg/dL   Total CHOL/HDL Ratio 2    NonHDL 120.03

## 2024-01-19 NOTE — Patient Instructions (Incomplete)
 It was good to see you today, I will be in touch with your labs

## 2024-01-23 ENCOUNTER — Encounter: Payer: Self-pay | Admitting: Family Medicine

## 2024-01-23 ENCOUNTER — Ambulatory Visit (INDEPENDENT_AMBULATORY_CARE_PROVIDER_SITE_OTHER): Admitting: Family Medicine

## 2024-01-23 VITALS — BP 114/72 | HR 80 | Ht 68.0 in | Wt 148.0 lb

## 2024-01-23 DIAGNOSIS — Z13220 Encounter for screening for lipoid disorders: Secondary | ICD-10-CM | POA: Diagnosis not present

## 2024-01-23 DIAGNOSIS — Z0000 Encounter for general adult medical examination without abnormal findings: Secondary | ICD-10-CM

## 2024-01-23 DIAGNOSIS — I10 Essential (primary) hypertension: Secondary | ICD-10-CM

## 2024-01-23 DIAGNOSIS — Z131 Encounter for screening for diabetes mellitus: Secondary | ICD-10-CM | POA: Diagnosis not present

## 2024-01-23 DIAGNOSIS — G43119 Migraine with aura, intractable, without status migrainosus: Secondary | ICD-10-CM

## 2024-01-23 DIAGNOSIS — E0500 Thyrotoxicosis with diffuse goiter without thyrotoxic crisis or storm: Secondary | ICD-10-CM

## 2024-01-23 LAB — LIPID PANEL
Cholesterol: 231 mg/dL — ABNORMAL HIGH (ref 0–200)
HDL: 110.9 mg/dL (ref >39.00)
LDL Cholesterol: 105 mg/dL — ABNORMAL HIGH (ref 0–99)
NonHDL: 120.03
Total CHOL/HDL Ratio: 2
Triglycerides: 75 mg/dL (ref 0.0–149.0)
VLDL: 15 mg/dL (ref 0.0–40.0)

## 2024-01-23 LAB — CBC
HCT: 36.2 % (ref 36.0–46.0)
Hemoglobin: 12.4 g/dL (ref 12.0–15.0)
MCHC: 34.3 g/dL (ref 30.0–36.0)
MCV: 92.9 fl (ref 78.0–100.0)
Platelets: 260 10*3/uL (ref 150.0–400.0)
RBC: 3.9 Mil/uL (ref 3.87–5.11)
RDW: 13.4 % (ref 11.5–15.5)
WBC: 4.5 10*3/uL (ref 4.0–10.5)

## 2024-01-23 LAB — COMPREHENSIVE METABOLIC PANEL WITH GFR
ALT: 11 U/L (ref 0–35)
AST: 18 U/L (ref 0–37)
Albumin: 4.8 g/dL (ref 3.5–5.2)
Alkaline Phosphatase: 41 U/L (ref 39–117)
BUN: 12 mg/dL (ref 6–23)
CO2: 31 meq/L (ref 19–32)
Calcium: 9.6 mg/dL (ref 8.4–10.5)
Chloride: 97 meq/L (ref 96–112)
Creatinine, Ser: 0.76 mg/dL (ref 0.40–1.20)
GFR: 90.38 mL/min (ref >60.00)
Glucose, Bld: 62 mg/dL — ABNORMAL LOW (ref 70–99)
Potassium: 4.8 meq/L (ref 3.5–5.1)
Sodium: 135 meq/L (ref 135–145)
Total Bilirubin: 0.6 mg/dL (ref 0.2–1.2)
Total Protein: 7.1 g/dL (ref 6.0–8.3)

## 2024-01-23 LAB — HEMOGLOBIN A1C: Hgb A1c MFr Bld: 5.5 % (ref 4.6–6.5)

## 2024-01-23 MED ORDER — SUMATRIPTAN SUCCINATE 100 MG PO TABS
100.0000 mg | ORAL_TABLET | ORAL | 1 refills | Status: AC | PRN
Start: 2024-01-23 — End: ?

## 2024-01-24 ENCOUNTER — Encounter: Payer: Self-pay | Admitting: Family Medicine

## 2024-03-24 ENCOUNTER — Encounter: Payer: Self-pay | Admitting: Internal Medicine

## 2024-03-24 ENCOUNTER — Ambulatory Visit: Payer: 59 | Admitting: Internal Medicine

## 2024-03-24 VITALS — BP 112/60 | HR 74 | Ht 68.0 in | Wt 148.2 lb

## 2024-03-24 DIAGNOSIS — E0500 Thyrotoxicosis with diffuse goiter without thyrotoxic crisis or storm: Secondary | ICD-10-CM | POA: Diagnosis not present

## 2024-03-24 NOTE — Progress Notes (Signed)
 Patient ID: Whitney Bowman, female   DOB: 03/28/1971, 53 y.o.   MRN: [id number]   HPI  Whitney Bowman is a 53 y.o.-year-old female, initially referred by her PCP, Dr. Watt for evaluation for Graves' disease.  Last visit 1 year ago.  Interim history: No tremors.  She continues to have tachycardia after meals, but not very bothersome.  She is seeing cardiology. She has occasional hot flushes at night. She also has migraines -on  Imitrex , forgetfulness.  She has acid reflux >> EGD showed esophagitis >> on Tagamet.  All of the symptoms were attributed to menopause - LMP 8 mo ago. She started Noom diet 08/2021 - avoiding processed foods and high calorie density foods.  She lost 38 pounds before the last 2 visits combined. She gained 9 pounds afterwards.  Reviewed history: Pt's TFTs were found to be abnormal this Spring- during an APE. Retrospectively, she had tachycardia for 10 years after the birth of her second child >> palpitations.  She started taking a cleanse supplement with iodine, kelp, seaweed (list of ingredients - scanned) in mid-07/2016 >> taking it for 2 months when she had labs in 09/2016.  She stopped these right after the labs returned.  She remembers having had a URI  in 06/2017: Bronchitis/sinusitis.  We started methimazole  5 mg twice daily.    We were able to decrease the dose to 2.5 mg daily in 06/2017 and I advised her to stop the methimazole  completely in 09/2017.  She read the message through my chart but did not stop the medication.     At our visit from 06/2018, I advised her to stop methimazole  completely, which she did.   However, in 02/2020, she had a mildly low TSH and a year later, in 02/2021 TSH was undetectable.  Methimazole  was restarted then.  In 04/2021, I advised her to continue methimazole  5 mg daily.  In 10/2021, I advised her to decrease methimazole  to 5 mg every other day.    Reviewed her TFTs: Lab Results  Component Value Date   TSH 2.46  09/25/2023   TSH 2.31 03/25/2023   TSH 1.61 08/31/2022   TSH 1.65 02/27/2022   TSH 3.36 11/07/2021   TSH 3.11 08/29/2021   TSH 2.23 06/27/2021   TSH 0.01 (L) 05/11/2021   TSH <0.01 (L) 04/17/2021   TSH <0.01 Repeated and verified X2. (L) 03/09/2021   FREET4 1.2 09/25/2023   FREET4 0.85 03/25/2023   FREET4 0.74 08/31/2022   FREET4 0.73 02/27/2022   FREET4 0.80 11/07/2021   FREET4 0.70 08/29/2021   FREET4 0.62 06/27/2021   FREET4 0.85 05/11/2021   FREET4 0.97 12/17/2018   FREET4 0.93 09/02/2018   T3FREE 3.0 09/25/2023   T3FREE 3.5 03/25/2023   T3FREE 3.0 08/31/2022   T3FREE 2.8 02/27/2022   T3FREE 3.1 11/07/2021   T3FREE 3.5 08/29/2021   T3FREE 3.4 06/27/2021   T3FREE 3.5 05/11/2021   T3FREE 3.6 04/17/2021   T3FREE 5.0 (H) 03/09/2021   Graves' antibodies were elevated: Lab Results  Component Value Date   TSI 184 (H) 03/25/2023   TSI 153 (H) 08/31/2022   TSI 251 (H) 05/11/2021   TSI <89 07/16/2018   TSI 241 (H) 12/21/2016   Her thyrotropin receptor antibodies were elevated: Component     Latest Ref Rng & Units 10/25/2016  Thyrotropin Receptor Ab     <=16.0 % 40.5 (H)    Pt denies: - feeling nodules in neck - hoarseness - dysphagia - choking She  has a history of palpitations (after her second pregnancy: Sees Dr. Shlomo with cardiology every other year; had a Holter monitor), insomnia, fatigue, initially weight loss but she has gained weight afterwards, anxiety, excessive sweating and heat intolerance, mild occasional tremors.  The symptoms resolved on methimazole .  Pt does not have a FH of thyroid  ds. No FH of thyroid  cancer. No h/o radiation tx to head or neck. No recent steroids use. She has family history of RA.  She had DUB and was previously on OCPs but stopped due to weight gain.  However, afterwards, she had D&C in 06/2020. She had a ear tube placed 06/2022 - after pain during flights. Had transient hearing loss.   ROS:  + see HPI  I reviewed pt's  medications, allergies, PMH, social hx, family hx, and changes were documented in the history of present illness. Otherwise, unchanged from my initial visit note.  Past Medical History:  Diagnosis Date   DUB (dysfunctional uterine bleeding)    Family history of aortic dissection    Family history of hypertrophic cardiomyopathy    History of closed head injury    per pt age 35 fell and had closed skull fracture , stated brief loc by no coma , and no residuals   History of Graves' disease    endocrinologist--  dr trixie-- dx 2018,  was on medication until 2019 due to lab work normallized, felt it may have to have taking OTC seaweed with four times the iodine   History of positive PPD    per pt positive skin test and cxr was abnormal was medication for a full year approx. 1991   Hypertension    Intermittent palpitations    cardiologist--- dr t. turner--  normal ETT ,echo , and CTA normal   Wears glasses    Past Surgical History:  Procedure Laterality Date   DILATATION & CURRETTAGE/HYSTEROSCOPY WITH RESECTOCOPE N/A 07/19/2020   Procedure: DILATATION & CURETTAGE/HYSTEROSCOPY WITH RESECTOCOPE;  Surgeon: Rutherford Gain, MD;  Location: [facility];  Service: Gynecology;  Laterality: N/A;   HYSTEROSCOPY WITH RESECTOSCOPE N/A 07/19/2020   Procedure: HYSTEROSCOPY WITH RESECTOSCOPE;  Surgeon: Rutherford Gain, MD;  Location: [facility];  Service: Gynecology;  Laterality: N/A;   UMBILICAL HERNIA REPAIR  08-09-2009  @[facility]    Social History   Social History   Marital status: Married    Spouse name: N/A   Number of children: 2 sons   Occupational History   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      diphenhydrAMINE  (BENADRYL  ALLERGY EXTRA STR) 50 MG tablet Take 1 tablet (50 mg total) by mouth once for 1 dose. 1. Prednisone  50 mg - take 13 hours prior to test 2. Take another Prednisone  50 mg 7 hours prior to test 3. Take another Prednisone  50 mg 1 hour prior to test 4. Take Benadryl  50 mg 1 hour prior to test (Patient not taking: Reported on 01/23/2024) 1 tablet 0   methimazole  (TAPAZOLE ) 5 MG tablet Take 1 tablet (5 mg total) by mouth every other day. 45 tablet 3  Multiple Vitamin (MULTIVITAMIN) tablet Take 1 tablet by mouth daily.     SUMAtriptan  (IMITREX ) 100 MG tablet Take 1 tablet (100 mg total) by mouth every 2 (two) hours as needed for migraine. May repeat in 2 hours if headache persists or recurs.  Max 200 mg per 24 hours 10 tablet 1   No current facility-administered medications on file prior to visit.   Allergies  Allergen Reactions   Cefdinir Diarrhea    REACTION: PROTRACTED DIARRHEA/ COLITIS   Shellfish Allergy Diarrhea and Nausea Only   Sudafed [Pseudoephedrine Hcl]     Tachycardia   Cephalosporins Diarrhea    severe   Codeine Nausea And Vomiting   Family History  Problem Relation Age of Onset   Hypertension Mother    Hypertension Father   Mother and father also had melanoma.  PE: BP 112/60   Pulse 74   Ht 5' 8 (1.727 m)   Wt 148 lb 3.2 oz (67.2 kg)   SpO2 99%   BMI 22.53 kg/m  Wt Readings from Last 20 Encounters:  03/24/24 148 lb 3.2 oz (67.2 kg)  01/23/24 148 lb (67.1 kg)  06/10/23 144 lb 3.2 oz (65.4 kg)  03/25/23 139 lb (63 kg)  11/08/22 141 lb 12.8 oz (64.3 kg)  08/31/22 138 lb 9.6 oz (62.9 kg)  04/05/22 145 lb 6.4 oz (66 kg)  02/27/22 150 lb 12.8 oz (68.4 kg)  02/13/22 149 lb (67.6 kg)  08/29/21 176 lb 9.6 oz (80.1 kg)  05/11/21 170 lb (77.1 kg)  03/09/21 169 lb (76.7 kg)  07/19/20 168 lb 14.4 oz (76.6 kg)  07/15/20 169 lb (76.7 kg)  07/07/20 171 lb  12.8 oz (77.9 kg)  03/07/20 168 lb (76.2 kg)  05/07/19 168 lb (76.2 kg)  04/22/19 169 lb (76.7 kg)  02/23/19 168 lb (76.2 kg)  07/16/18 173 lb (78.5 kg)   Constitutional: normal weight, in NAD Eyes:  EOMI, no exophthalmos ENT: no neck masses, no cervical lymphadenopathy Cardiovascular: RRR, No MRG Respiratory: CTA B Musculoskeletal: no deformities Skin:no rashes Neurological: no tremor with outstretched hands  ASSESSMENT: 1.Graves' disease -recurrent  PLAN:  1. Patient with history of Graves' disease, diagnosed in 2018, initially with mild thyrotoxic symptoms including some weight loss, heat intolerance, anxiety, tremors, palpitations.  Her TSI and TRAb antibodies were positive.  We initially started her on methimazole  5 mg twice a day and we were able to reduce the dose and then stopped completely in 06/2018.  She was lost for follow-up afterwards and her PCP contacted me with undetectable TSH levels in 08 and 03/2021.  At that time, I advised her to restart methimazole  at 5 mg twice a day.  When she returns to see me she was forgetting the second dose of the day approximately 50% of the time but she was feeling better on methimazole , with less frequent stools and less insomnia.  She also had palpitations for which she saw cardiology.  Her pulse improved afterwards without a beta-blocker.  We continued 5 mg of methimazole  daily and TFTs normalized in 05/2021.  The last decrease in the methimazole  dose was in 10/2018 3 to 5 mg every other day, with normal TFTs afterwards, including at last check, in 08/2023.  At last visit, her Graves' antibodies (TSI's) were still elevated, at 184. - Before the last 2 visits she lost 38 pounds, intentionally, on the Noom diet.  She gained 5 pounds since last visit. - Since last visit, she gained 9 pounds back.  She is feeling well, but has palpitations after meals, which are chronic for her and for which she is followed by cardiology.  Also, she has hot  flashes, possibly postmenopausal since her LMP was 8 months ago.  She also has more frequent migraines and acid reflux, both apparently attributed to menopause. - no active signs of Graves' ophthalmopathy: Blurry vision, double vision, eye pain - For now, she does not appear to require RAI ablation or thyroidectomy.  We again discussed that staying on the low-dose methimazole  was found to be safe and effective in preventing Graves' disease recurrence - I will see her back in a year, but in 6 months for labs  Needs refills.  Orders Placed This Encounter  Procedures   TSH   T4, free   T3, free   Thyroid  stimulating immunoglobulin   Lela Fendt, MD PhD [facility] Endocr[facility]

## 2024-03-24 NOTE — Patient Instructions (Signed)
Please continue Methimazole 5 mg every other day.  Please stop at the lab.  You should have an endocrinology follow-up appointment in 1 year but with labs at 6 months.

## 2024-03-25 ENCOUNTER — Ambulatory Visit: Payer: Self-pay | Admitting: Internal Medicine

## 2024-03-26 LAB — THYROID STIMULATING IMMUNOGLOBULIN: TSI: 296 %{baseline} — ABNORMAL HIGH (ref <140)

## 2024-03-26 LAB — TSH: TSH: 1.25 m[IU]/L

## 2024-03-26 LAB — T3, FREE: T3, Free: 3.1 pg/mL (ref 2.3–4.2)

## 2024-03-26 LAB — T4, FREE: Free T4: 1.3 ng/dL (ref 0.8–1.8)

## 2024-03-27 ENCOUNTER — Encounter: Payer: Self-pay | Admitting: Internal Medicine

## 2024-03-27 MED ORDER — METHIMAZOLE 5 MG PO TABS: 5.0000 mg | ORAL_TABLET | ORAL | 3 refills | Status: AC

## 2024-03-27 NOTE — Addendum Note (Signed)
 Addended by: TRIXIE FILE on: 03/27/2024 01:07 PM   Modules accepted: Orders

## 2024-04-28 ENCOUNTER — Other Ambulatory Visit: Payer: Self-pay | Admitting: Obstetrics and Gynecology

## 2024-04-28 DIAGNOSIS — Z1231 Encounter for screening mammogram for malignant neoplasm of breast: Secondary | ICD-10-CM

## 2024-05-22 ENCOUNTER — Encounter: Payer: Self-pay | Admitting: Internal Medicine

## 2024-05-22 ENCOUNTER — Encounter: Payer: Self-pay | Admitting: Family Medicine

## 2024-06-11 ENCOUNTER — Ambulatory Visit
Admission: RE | Admit: 2024-06-11 | Discharge: 2024-06-11 | Disposition: A | Source: Ambulatory Visit | Attending: Obstetrics and Gynecology | Admitting: Obstetrics and Gynecology

## 2024-06-11 DIAGNOSIS — Z1231 Encounter for screening mammogram for malignant neoplasm of breast: Secondary | ICD-10-CM

## 2025-03-23 ENCOUNTER — Ambulatory Visit: Admitting: Internal Medicine
